# Patient Record
Sex: Female | Born: 1995 | Race: White | Hispanic: No | Marital: Married | Smoking: Never smoker
Health system: Southern US, Community
[De-identification: ages and names within clinical notes are randomized; demographics above are authoritative.]

## PROBLEM LIST (undated history)

## (undated) DIAGNOSIS — I519 Heart disease, unspecified: Secondary | ICD-10-CM

## (undated) DIAGNOSIS — I471 Supraventricular tachycardia, unspecified: Secondary | ICD-10-CM

## (undated) DIAGNOSIS — I456 Pre-excitation syndrome: Secondary | ICD-10-CM

## (undated) DIAGNOSIS — R011 Cardiac murmur, unspecified: Secondary | ICD-10-CM

## (undated) DIAGNOSIS — I499 Cardiac arrhythmia, unspecified: Secondary | ICD-10-CM

## (undated) DIAGNOSIS — N946 Dysmenorrhea, unspecified: Secondary | ICD-10-CM

## (undated) DIAGNOSIS — Z8619 Personal history of other infectious and parasitic diseases: Secondary | ICD-10-CM

## (undated) DIAGNOSIS — F419 Anxiety disorder, unspecified: Secondary | ICD-10-CM

## (undated) HISTORY — DX: Personal history of other infectious and parasitic diseases: Z86.19

## (undated) HISTORY — DX: Supraventricular tachycardia, unspecified: I47.10

## (undated) HISTORY — DX: Pre-excitation syndrome: I45.6

## (undated) HISTORY — DX: Anxiety disorder, unspecified: F41.9

## (undated) HISTORY — DX: Heart disease, unspecified: I51.9

## (undated) HISTORY — DX: Dysmenorrhea, unspecified: N94.6

## (undated) HISTORY — DX: Cardiac arrhythmia, unspecified: I49.9

## (undated) HISTORY — DX: Other specified conditions originating in the perinatal period: R01.1

## (undated) HISTORY — DX: Supraventricular tachycardia: I47.1

---
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
## 2014-03-15 ENCOUNTER — Other Ambulatory Visit: Payer: Self-pay | Admitting: Family Medicine

## 2014-06-01 ENCOUNTER — Ambulatory Visit (INDEPENDENT_AMBULATORY_CARE_PROVIDER_SITE_OTHER): Payer: Managed Care, Other (non HMO) | Admitting: Family Medicine

## 2014-06-01 ENCOUNTER — Encounter: Payer: Self-pay | Admitting: Family Medicine

## 2014-06-01 VITALS — BP 118/78 | HR 117 | Temp 99.0°F | Wt 203.4 lb

## 2014-06-01 DIAGNOSIS — J029 Acute pharyngitis, unspecified: Secondary | ICD-10-CM | POA: Insufficient documentation

## 2014-06-01 NOTE — Patient Instructions (Signed)
I think your sore throat is coming from a virus  Antibiotics do not help  Your strep test is negative Drink lots of fluids Gargle with salt water  Try chloraseptic throat spray  Tylenol or motrin/ advil may sore throat -take as directed (also will help fever)  Get lots of rest    Update if not starting to improve in a week or if worsening

## 2014-06-01 NOTE — Assessment & Plan Note (Signed)
Suspect viral  Neg RST Disc symptomatic care - see instructions on AVS   Update if not starting to improve in a week or if worsening

## 2014-06-01 NOTE — Progress Notes (Signed)
Pre visit review using our clinic review tool, if applicable. No additional management support is needed unless otherwise documented below in the visit note. 

## 2014-06-01 NOTE — Progress Notes (Signed)
Subjective:    Patient ID: Abigail Pham, female    DOB: 1995/04/09, 19 y.o.   MRN: [id number]  HPI Here with ST and feeling poorly   Started last night 10 pm  ST is fairly bad  No cough or runny nose   99 temp today  ? If fever at home  No chills  Feels hot  No body aches   Feels tired and weak  Does have a mild headache  No rash   Results for orders placed or performed in visit on 06/01/14  POCT rapid strep A  Result Value Ref Range   Rapid Strep A Screen Negative Negative     Patient Active Problem List   Diagnosis Date Noted  . Poor concentration 11/06/2013  . History of ADHD 11/06/2013  . Dysmenorrhea 10/20/2012  . Screen for STD (sexually transmitted disease) 10/20/2012  . WPW (Wolff-Parkinson-White syndrome) 11/09/2010   Past Medical History  Diagnosis Date  . WPW (Wolff-Parkinson-White syndrome)     Hosp at [facility] at one month of age for Tachyarrrythmia and pulmonary hemorrhage  . History of chicken pox     around age 89  . Heart murmur of newborn    No past surgical history on file. History  Substance Use Topics  . Smoking status: Passive Smoke Exposure - Never Smoker  . Smokeless tobacco: Not on file     Comment: pt smokes vapors occ  . Alcohol Use: No   Family History  Problem Relation Age of Onset  . Stroke Paternal Grandfather   . High blood pressure Father   . High blood pressure Paternal Grandfather    Allergies  Allergen Reactions  . Other Other (See Comments)    Antihistamines or other stimulants-heart races   Current Outpatient Prescriptions on File Prior to Visit  Medication Sig Dispense Refill  . norethindrone-ethinyl estradiol (JUNEL FE,GILDESS FE,LOESTRIN FE) 1-20 MG-MCG tablet TAKE 1 TABLET BY MOUTH DAILY. 84 tablet 1   No current facility-administered medications on file prior to visit.      Review of Systems Review of Systems  Constitutional: Negative for fever, appetite change, fatigue and unexpected weight change.    Eyes: Negative for pain and visual disturbance.  ENT neg for rhinorrhea or cong, pos for post drip and st Respiratory: Negative for cough and shortness of breath.  neg for wheeze  Cardiovascular: Negative for cp or palpitations    Gastrointestinal: Negative for nausea, diarrhea and constipation.  Genitourinary: Negative for urgency and frequency.  Skin: Negative for pallor or rash   Neurological: Negative for weakness, light-headedness, numbness and headaches.  Hematological: Negative for adenopathy. Does not bruise/bleed easily.  Psychiatric/Behavioral: Negative for dysphoric mood. The patient is not nervous/anxious.         Objective:   Physical Exam  Constitutional: She appears well-developed and well-nourished. No distress.  obese and well appearing   HENT:  Head: Normocephalic and atraumatic.  Right Ear: External ear normal.  Left Ear: External ear normal.  Mouth/Throat: Oropharynx is clear and moist. No oropharyngeal exudate.  Boggy nares  Marked clear post nasal mucous drainage No redness or swelling or throat  No throat lesions   Eyes: Conjunctivae and EOM are normal. Pupils are equal, round, and reactive to light. Right eye exhibits no discharge. Left eye exhibits no discharge.  Neck: Normal range of motion. Neck supple.  Cardiovascular: Regular rhythm and normal heart sounds.   Pulmonary/Chest: Effort normal and breath sounds normal. No respiratory distress. She has  no wheezes. She has no rales. She exhibits no tenderness.  Lymphadenopathy:    She has no cervical adenopathy.  Neurological: She is alert.  Skin: Skin is warm and dry. No rash noted. No erythema.  Psychiatric: She has a normal mood and affect.          Assessment & Plan:   Problem List Items Addressed This Visit      Other   Sore throat - Primary    Suspect viral  Neg RST Disc symptomatic care - see instructions on AVS   Update if not starting to improve in a week or if worsening         Relevant Orders   POCT rapid strep A

## 2014-06-03 ENCOUNTER — Telehealth: Payer: Self-pay | Admitting: Family Medicine

## 2014-06-03 LAB — POCT RAPID STREP A (OFFICE): RAPID STREP A SCREEN: NEGATIVE

## 2014-06-03 NOTE — Telephone Encounter (Signed)
I would agree with re eval if persistent sxs into next week vs urgent care over weekend.

## 2014-06-03 NOTE — Telephone Encounter (Signed)
Patient Name: Abigail SoxKALIE Hensch DOB: February 15, 1996 Initial Comment Caller states his daughter was seen on Tues. She had an irritated throat. There are white spots. The strep test came up negative. Nurse Assessment Nurse: Loletta Specterorbin, RN, Misty StanleyLisa Date/Time Lamount Cohen(Eastern Time): 06/03/2014 4:09:39 PM Confirm and document reason for call. If symptomatic, describe symptoms. ---Caller states his daughter has cough, congestion and runny nose since Tuesday. Sore throat since Tuesday. Low grade fever. Seen in office on Tuesday and rapid strep was negative, Has the patient traveled out of the country within the last 30 days? ---No Does the patient require triage? ---Yes Related visit to physician within the last 2 weeks? ---Yes Does the PT have any chronic conditions? (i.e. diabetes, asthma, etc.) ---No Did the patient indicate they were pregnant? ---No Guidelines Guideline Title Affirmed Question Affirmed Notes Sore Throat [1] Sore throat with cough/cold symptoms AND [2] present > 5 days Final Disposition User See PCP When Office is Open (within 3 days) Loletta Specterorbin, Charity fundraiserN, Misty StanleyLisa

## 2014-06-07 NOTE — Telephone Encounter (Signed)
Spoke with patient's grandmother who said that patient was feeling better and was at work. She said she will have patient return my call tomorrow.

## 2014-11-15 ENCOUNTER — Other Ambulatory Visit: Payer: Self-pay | Admitting: Family Medicine

## 2014-11-16 NOTE — Telephone Encounter (Signed)
Electronic refill request, no recent f/u or CPE and no future appt, please advise  

## 2014-11-16 NOTE — Telephone Encounter (Signed)
Please schedule PE with first gyn exam after 08/21/15 and refill until then Thanks

## 2014-11-17 NOTE — Telephone Encounter (Signed)
Pt will call back closer to that date to schedule CPE, med refilled

## 2015-05-01 ENCOUNTER — Other Ambulatory Visit: Payer: Self-pay | Admitting: Family Medicine

## 2015-05-02 NOTE — Telephone Encounter (Signed)
Electronic refill request, no recent/future appt., please advise  

## 2015-05-02 NOTE — Telephone Encounter (Signed)
Please schedule summer f/u or PE and refill until then thanks

## 2015-05-03 NOTE — Telephone Encounter (Signed)
Spoke to patient.  CPE scheduled for 09/16/15 at 1545.  Refill approved to last until that time.

## 2015-05-24 ENCOUNTER — Ambulatory Visit (INDEPENDENT_AMBULATORY_CARE_PROVIDER_SITE_OTHER): Payer: Managed Care, Other (non HMO) | Admitting: Family Medicine

## 2015-05-24 ENCOUNTER — Encounter: Payer: Self-pay | Admitting: Family Medicine

## 2015-05-24 VITALS — BP 128/68 | HR 100 | Temp 98.3°F | Ht 67.25 in | Wt 195.2 lb

## 2015-05-24 DIAGNOSIS — J0100 Acute maxillary sinusitis, unspecified: Secondary | ICD-10-CM | POA: Diagnosis not present

## 2015-05-24 DIAGNOSIS — H6502 Acute serous otitis media, left ear: Secondary | ICD-10-CM | POA: Diagnosis not present

## 2015-05-24 DIAGNOSIS — J019 Acute sinusitis, unspecified: Secondary | ICD-10-CM | POA: Insufficient documentation

## 2015-05-24 DIAGNOSIS — H6690 Otitis media, unspecified, unspecified ear: Secondary | ICD-10-CM | POA: Insufficient documentation

## 2015-05-24 MED ORDER — ALBUTEROL SULFATE HFA 108 (90 BASE) MCG/ACT IN AERS: 2.0000 | INHALATION_SPRAY | RESPIRATORY_TRACT | Status: DC | PRN

## 2015-05-24 MED ORDER — AMOXICILLIN-POT CLAVULANATE 875-125 MG PO TABS: 1.0000 | ORAL_TABLET | Freq: Two times a day (BID) | ORAL | Status: DC

## 2015-05-24 MED ORDER — GUAIFENESIN-CODEINE 100-10 MG/5ML PO SYRP: 5.0000 mL | ORAL_SOLUTION | Freq: Four times a day (QID) | ORAL | Status: DC | PRN

## 2015-05-24 NOTE — Patient Instructions (Addendum)
Don't go back to vaping nicotine  Drink lots of fluids and rest  Take augmentin for the ear infection and sinus infection  Take the robitussin with codeine for cough as needed when not working or driving (that will sedate)  Use the inhaler if wheezing as directed (this will raise heart rate briefly after use) -see the handout I gave you re: technique  Update if not starting to improve in a week or if worsening

## 2015-05-24 NOTE — Progress Notes (Signed)
Subjective:    Patient ID: Abigail Pham, female    DOB: 02/22/1996, 20 y.o.   MRN: [id number]  HPI  Here with uri symptoms for 6 days  (acutally longer and then got better and then worse) Hoarse voice   Not getting better  Coughing a lot - so hard she almost vomits, phlegm is green yellow  Lot of congestion - green yellow  Ears hurt - L is worse  Headache - in her face - cheeks and jaw Ribs are sore  Some wheeze on and off  pnd  L eye is red also itchy and draining   Had temp a few days ago 103 - came down with an "ice pack"  Taking mucinex and robitussin  No further fever No analgesics   Non smoker Does vap 3 mg nicotine   Patient Active Problem List   Diagnosis Date Noted  . Sore throat 06/01/2014  . Poor concentration 11/06/2013  . History of ADHD 11/06/2013  . Dysmenorrhea 10/20/2012  . Screen for STD (sexually transmitted disease) 10/20/2012  . WPW (Wolff-Parkinson-White syndrome) 11/09/2010   Past Medical History  Diagnosis Date  . WPW (Wolff-Parkinson-White syndrome)     Hosp at [facility] at one month of age for Tachyarrrythmia and pulmonary hemorrhage  . History of chicken pox     around age 17  . Heart murmur of newborn    No past surgical history on file. Social History  Substance Use Topics  . Smoking status: Passive Smoke Exposure - Never Smoker  . Smokeless tobacco: None     Comment: pt smokes vapors occ  . Alcohol Use: No   Family History  Problem Relation Age of Onset  . Stroke Paternal Grandfather   . High blood pressure Father   . High blood pressure Paternal Grandfather    Allergies  Allergen Reactions  . Other Other (See Comments)    Antihistamines or other stimulants-heart races   Current Outpatient Prescriptions on File Prior to Visit  Medication Sig Dispense Refill  . JUNEL FE 1/20 1-20 MG-MCG tablet TAKE 1 TABLET BY MOUTH EVERY DAY 84 tablet 0   No current facility-administered medications on file prior to visit.    Review of  Systems  Constitutional: Positive for appetite change and fatigue. Negative for fever.  HENT: Positive for congestion, ear pain, postnasal drip, rhinorrhea, sinus pressure, sneezing and sore throat. Negative for nosebleeds.   Eyes: Positive for discharge. Negative for pain, redness and itching.  Respiratory: Positive for cough. Negative for shortness of breath, wheezing and stridor.   Cardiovascular: Negative for chest pain.  Gastrointestinal: Negative for nausea, vomiting, abdominal pain and diarrhea.  Endocrine: Negative for polyuria.  Genitourinary: Negative for dysuria, urgency, frequency and hematuria.  Musculoskeletal: Negative for myalgias and arthralgias.  Skin: Negative for rash.  Allergic/Immunologic: Negative for immunocompromised state.  Neurological: Positive for headaches. Negative for dizziness, tremors, syncope, weakness, light-headedness and numbness.  Hematological: Negative for adenopathy. Does not bruise/bleed easily.  Psychiatric/Behavioral: Negative for confusion and dysphoric mood. The patient is not nervous/anxious.        Objective:   Physical Exam  Constitutional: She appears well-developed and well-nourished. No distress.  obese and well appearing   HENT:  Head: Normocephalic and atraumatic.  Right Ear: External ear normal.  Mouth/Throat: Oropharynx is clear and moist. No oropharyngeal exudate.  Nares are injected and congested  Bilateral maxillary sinus tenderness -mild  L TM is erythematous with effusion  Post nasal drip noted Throat is  clear   Hoarse voice   Eyes: Conjunctivae and EOM are normal. Pupils are equal, round, and reactive to light. Right eye exhibits no discharge. Left eye exhibits no discharge.  Neck: Normal range of motion. Neck supple.  Cardiovascular: Regular rhythm and normal heart sounds.   Pulmonary/Chest: Effort normal and breath sounds normal. No respiratory distress. She has no wheezes. She has no rales. She exhibits no  tenderness.  No wheezes heard today Harsh bs   Lymphadenopathy:    She has no cervical adenopathy.  Neurological: She is alert. No cranial nerve deficit.  Skin: Skin is warm and dry. No rash noted.  Psychiatric: She has a normal mood and affect.          Assessment & Plan:   Problem List Items Addressed This Visit      Respiratory   Acute sinusitis    With cough and L OM  Pt also c/o wheezing (none on exam today) Cover with augmentin (will use back up contraception) Robitussin AC for cough with caution of sedation  Albuterol mdi -only if needed/disc risk of inc HR /given handout on technique  Adv to quit vaping nicotine as well   Update if not starting to improve in a week or if worsening        Relevant Medications   amoxicillin-clavulanate (AUGMENTIN) 875-125 MG tablet   guaiFENesin-codeine (ROBITUSSIN AC) 100-10 MG/5ML syrup     Nervous and Auditory   Otitis media - Primary    L with effusion  With uri and sinus infection  Cover with augmentin  Disc symptomatic care - see instructions on AVS  Update if not starting to improve in a week or if worsening        Relevant Medications   amoxicillin-clavulanate (AUGMENTIN) 875-125 MG tablet

## 2015-05-24 NOTE — Assessment & Plan Note (Signed)
L with effusion  With uri and sinus infection  Cover with augmentin  Disc symptomatic care - see instructions on AVS  Update if not starting to improve in a week or if worsening

## 2015-05-24 NOTE — Progress Notes (Signed)
Pre visit review using our clinic review tool, if applicable. No additional management support is needed unless otherwise documented below in the visit note. 

## 2015-05-24 NOTE — Assessment & Plan Note (Signed)
With cough and L OM  Pt also c/o wheezing (none on exam today) Cover with augmentin (will use back up contraception) Robitussin AC for cough with caution of sedation  Albuterol mdi -only if needed/disc risk of inc HR /given handout on technique  Adv to quit vaping nicotine as well   Update if not starting to improve in a week or if worsening

## 2015-06-06 ENCOUNTER — Other Ambulatory Visit: Payer: Self-pay | Admitting: Family Medicine

## 2015-09-16 ENCOUNTER — Encounter: Payer: Self-pay | Admitting: Family Medicine

## 2015-09-16 DIAGNOSIS — Z0289 Encounter for other administrative examinations: Secondary | ICD-10-CM

## 2015-12-01 ENCOUNTER — Encounter: Payer: Self-pay | Admitting: Family Medicine

## 2015-12-01 ENCOUNTER — Ambulatory Visit: Payer: Self-pay | Admitting: Family Medicine

## 2015-12-01 ENCOUNTER — Ambulatory Visit (INDEPENDENT_AMBULATORY_CARE_PROVIDER_SITE_OTHER): Payer: Self-pay | Admitting: Family Medicine

## 2015-12-01 VITALS — BP 112/82 | HR 85 | Wt 194.0 lb

## 2015-12-01 DIAGNOSIS — R300 Dysuria: Secondary | ICD-10-CM

## 2015-12-01 DIAGNOSIS — N3090 Cystitis, unspecified without hematuria: Secondary | ICD-10-CM

## 2015-12-01 LAB — POC URINALSYSI DIPSTICK (AUTOMATED)
BILIRUBIN UA: NEGATIVE
GLUCOSE UA: NEGATIVE
Ketones, UA: NEGATIVE
Nitrite, UA: NEGATIVE
Protein, UA: NEGATIVE
RBC UA: NEGATIVE
SPEC GRAV UA: 1.025
UROBILINOGEN UA: NEGATIVE
pH, UA: 6

## 2015-12-01 MED ORDER — NITROFURANTOIN MONOHYD MACRO 100 MG PO CAPS: 100.0000 mg | ORAL_CAPSULE | Freq: Two times a day (BID) | ORAL | 0 refills | Status: DC

## 2015-12-01 NOTE — Progress Notes (Signed)
Subjective:    Patient ID: Abigail Pham, female    DOB: 04-18-1995, 20 y.o.   MRN: [id number]  HPI This a 20 yo female who presents today with intermittent urinary frequency for about 3 weeks. She has noticed intermittent burning. She felt bad yesterday with urinary frequency and dysuria. Nocturia 2-3x. She has noticed some light headedness about 2-3 times (last episode about 4 days ago). Some vaginal itching yesterday. No discharge. Not sexually active currently. Patient's last menstrual period was 11/20/2015.   Past Medical History:  Diagnosis Date  . Heart murmur of newborn   . History of chicken pox    around age 13  . WPW (Wolff-Parkinson-White syndrome)    Hosp at [facility] at one month of age for Tachyarrrythmia and pulmonary hemorrhage   No past surgical history on file. Family History  Problem Relation Age of Onset  . Stroke Paternal Grandfather   . High blood pressure Father   . High blood pressure Paternal Grandfather    Social History  Substance Use Topics  . Smoking status: Passive Smoke Exposure - Never Smoker  . Smokeless tobacco: Not on file     Comment: pt smokes vapors occ with nicotine 3 mg   . Alcohol use No     Review of Systems Per HPI    Objective:   Physical Exam  Constitutional: She is oriented to person, place, and time. She appears well-developed and well-nourished. No distress.  HENT:  Head: Normocephalic and atraumatic.  Eyes: Conjunctivae are normal.  Cardiovascular: Normal rate, regular rhythm and normal heart sounds.   Pulmonary/Chest: Effort normal and breath sounds normal.  Abdominal: Soft. Bowel sounds are normal. She exhibits no distension. There is no tenderness. There is no rebound, no guarding and no CVA tenderness.  Neurological: She is alert and oriented to person, place, and time.  Skin: Skin is warm and dry. She is not diaphoretic.  Psychiatric: She has a normal mood and affect. Her behavior is normal. Judgment and thought content  normal.  Vitals reviewed.     BP 112/82   Pulse 85   Wt 194 lb (88 kg)   LMP 11/20/2015   SpO2 98%   BMI 30.16 kg/m  Wt Readings from Last 3 Encounters:  12/01/15 194 lb (88 kg)  05/24/15 195 lb 4 oz (88.6 kg)  06/01/14 203 lb 6.4 oz (92.3 kg) (98 %, Z= 1.99)*   * Growth percentiles are based on CDC 2-20 Years data.   Results for orders placed or performed in visit on 12/01/15  POCT Urinalysis Dipstick (Automated)  Result Value Ref Range   Color, UA YELLOW    Clarity, UA CLOUDY    Glucose, UA NEGATIVE    Bilirubin, UA NEGATIVE    Ketones, UA NEGATIVE    Spec Grav, UA 1.025    Blood, UA NEG    pH, UA 6.0    Protein, UA NEG    Urobilinogen, UA negative    Nitrite, UA NEG    Leukocytes, UA small (1+) (A) Negative       Assessment & Plan:  1. Dysuria - POCT Urinalysis Dipstick (Automated)  2. Cystitis - Provided written and verbal information regarding diagnosis and treatment. - Urine culture - nitrofurantoin, macrocrystal-monohydrate, (MACROBID) 100 MG capsule; Take 1 capsule (100 mg total) by mouth 2 (two) times daily.  Dispense: 14 capsule; Refill: 0 - POCT Urinalysis Dipstick (Automated) - RTC precautions reviewed  Olean Ree, FNP-BC  [facility]  [place], [place]  12/01/2015 3:48 PM

## 2015-12-01 NOTE — Patient Instructions (Signed)

## 2015-12-04 LAB — URINE CULTURE

## 2015-12-17 ENCOUNTER — Telehealth: Payer: Self-pay | Admitting: Family Medicine

## 2015-12-19 NOTE — Telephone Encounter (Signed)
Pt no-showed her CPE in July and has only been seen for recent acute appts

## 2015-12-19 NOTE — Telephone Encounter (Signed)
Please re schedule her PE and refill until then 

## 2015-12-20 NOTE — Telephone Encounter (Signed)
Patient advised and says she will call in to schedule CPE when she can look at her calendar.

## 2016-01-05 MED ORDER — NORETHIN ACE-ETH ESTRAD-FE 1-20 MG-MCG PO TABS: 1.0000 | ORAL_TABLET | Freq: Every day | ORAL | 0 refills | Status: DC

## 2016-01-05 NOTE — Telephone Encounter (Signed)
Patient called and scheduled physical appointment on 02/10/16.  Patient said her bcp will run out this Sunday.  Please call in rx for bcp.

## 2016-01-05 NOTE — Telephone Encounter (Signed)
done

## 2016-01-05 NOTE — Addendum Note (Signed)
Addended by: Shon MilletWATLINGTON, Justis Dupas M on: 01/05/2016 05:03 PM   Modules accepted: Orders

## 2016-01-20 ENCOUNTER — Other Ambulatory Visit: Payer: Self-pay | Admitting: Family Medicine

## 2016-01-28 ENCOUNTER — Encounter: Payer: Self-pay | Admitting: *Deleted

## 2016-01-28 ENCOUNTER — Emergency Department
Admission: EM | Admit: 2016-01-28 | Discharge: 2016-01-28 | Disposition: A | Discharge status: Home / Self Care | Payer: Self-pay | Attending: Emergency Medicine | Admitting: Emergency Medicine

## 2016-01-28 DIAGNOSIS — Z79899 Other long term (current) drug therapy: Secondary | ICD-10-CM | POA: Insufficient documentation

## 2016-01-28 DIAGNOSIS — T2611XA Burn of cornea and conjunctival sac, right eye, initial encounter: Secondary | ICD-10-CM | POA: Insufficient documentation

## 2016-01-28 DIAGNOSIS — Y929 Unspecified place or not applicable: Secondary | ICD-10-CM | POA: Insufficient documentation

## 2016-01-28 DIAGNOSIS — Y999 Unspecified external cause status: Secondary | ICD-10-CM | POA: Insufficient documentation

## 2016-01-28 DIAGNOSIS — X12XXXA Contact with other hot fluids, initial encounter: Secondary | ICD-10-CM | POA: Insufficient documentation

## 2016-01-28 DIAGNOSIS — Z7722 Contact with and (suspected) exposure to environmental tobacco smoke (acute) (chronic): Secondary | ICD-10-CM | POA: Insufficient documentation

## 2016-01-28 DIAGNOSIS — T2641XA Burn of right eye and adnexa, part unspecified, initial encounter: Secondary | ICD-10-CM

## 2016-01-28 DIAGNOSIS — Y9389 Activity, other specified: Secondary | ICD-10-CM | POA: Insufficient documentation

## 2016-01-28 MED ORDER — TETRACAINE HCL 0.5 % OP SOLN
2.0000 [drp] | Freq: Once | OPHTHALMIC | Status: AC
  Administered 2016-01-28: 2 [drp] via OPHTHALMIC

## 2016-01-28 MED ORDER — FLUORESCEIN SODIUM 1 MG OP STRP: 1.0000 | ORAL_STRIP | Freq: Once | OPHTHALMIC | Status: DC

## 2016-01-28 MED ORDER — ERYTHROMYCIN 5 MG/GM OP OINT
TOPICAL_OINTMENT | Freq: Once | OPHTHALMIC | Status: AC
  Administered 2016-01-28: 1 via OPHTHALMIC
  Filled 2016-01-28: qty 1

## 2016-01-28 MED ORDER — ERYTHROMYCIN 5 MG/GM OP OINT: TOPICAL_OINTMENT | Freq: Three times a day (TID) | OPHTHALMIC | 0 refills | Status: AC

## 2016-01-28 NOTE — ED Triage Notes (Signed)
Pt states she was vaping and the liquid became hot enough that it popped, striking R eye. Pt c/o eye pain x 1 hr, blurred and double vision. Pt states there is a gray spot on her R eye where the liquid from the vaping machine splattered in her eye. Pt's R eyelid is red and swollen.

## 2016-01-28 NOTE — ED Notes (Addendum)
Md at bedside. Right eye has grey dot at center. Pt reports blurred vision.

## 2016-01-28 NOTE — ED Notes (Addendum)
Poison control contacted and gave directions at followed; Irrigate eye with copious amount of water/NS for 15 mins followed by full eye exam. Lequita HaltMorgan lenses suggested. Assess acuity issues. Grey dot expects and follow up with opthalmologic.  MD made aware.

## 2016-01-28 NOTE — ED Notes (Signed)
Pt at eye wash station

## 2016-01-28 NOTE — ED Notes (Signed)
Pt reports burning in right eye. MD made aware and gave verbal order for 2 more drops of tetracaine in the right eye

## 2016-01-28 NOTE — ED Notes (Signed)
Pt reports her eye "feels better."

## 2016-01-28 NOTE — ED Notes (Signed)
MD at bedside. Dr. Manson PasseyBrown applied 2 drops of Alcon in right eye. Pt describes relief of eye pain.

## 2016-01-28 NOTE — ED Provider Notes (Signed)
[facility] Emergency Department Provider Note  ____________________________________________  Time seen: 3:45 AM  I have reviewed the triage vital signs and the nursing notes.   HISTORY  Chief Complaint Eye Injury     HPI Abigail Pham is a 20 y.o. female presents with history of accidentally getting vapor fluid liquid into her right eye approximately one hour before presentation. Patient admits to right eye pain as well as blurred vision     Past Medical History:  Diagnosis Date  . Heart murmur of newborn   . History of chicken pox    around age 89  . WPW (Wolff-Parkinson-White syndrome)    [facility] one [facility] of [facility] for Tachyarrrythmia and pulmonary hemorrhage    Patient Active Problem List   Diagnosis Date Noted  . Otitis media 05/24/2015  . Acute sinusitis 05/24/2015  . Sore throat 06/01/2014  . Poor concentration 11/06/2013  . History of ADHD 11/06/2013  . Dysmenorrhea 10/20/2012  . Screen for STD (sexually transmitted disease) 10/20/2012  . WPW (Wolff-Parkinson-White syndrome) 11/09/2010    History reviewed. No pertinent surgical history.  Current Outpatient Rx  . Order #: [id number] Class: Normal  . Order #: [id number] Class: Normal  . Order #: [id number] Class: Normal    Allergies Other  Family History  Problem Relation Age of Onset  . Stroke Paternal Grandfather   . High blood pressure Paternal Grandfather   . High blood pressure Father     Social History Social History  Substance Use Topics  . Smoking status: Passive Smoke Exposure - Never Smoker  . Smokeless tobacco: Never Used     Comment: pt smokes vapors occ with nicotine 3 mg   . Alcohol use 0.0 oz/week    Review of Systems  Constitutional: Negative for fever. Eyes: Negative for visual changes. ENT: Negative for sore throat. Cardiovascular: Negative for chest pain. Respiratory: Negative for shortness of breath. Gastrointestinal: Negative for abdominal pain,  vomiting and diarrhea. Genitourinary: Negative for dysuria. Musculoskeletal: Negative for back pain. Skin: Negative for rash. Neurological: Negative for headaches, focal weakness or numbness.   10-point ROS otherwise negative.  ____________________________________________   PHYSICAL EXAM:  VITAL SIGNS: ED Triage Vitals  Enc Vitals Group     BP 01/28/16 0334 135/87     Pulse Rate 01/28/16 0334 92     Resp 01/28/16 0334 20     Temp 01/28/16 0334 98.1 F (36.7 C)     Temp Source 01/28/16 0334 Oral     SpO2 01/28/16 0334 98 %     Weight 01/28/16 0335 192 lb (87.1 kg)     Height 01/28/16 0335 5\' 8"  (1.727 m)     Head Circumference --      Peak Flow --      Pain Score 01/28/16 0335 8     Pain Loc --      Pain Edu? --      Excl. in GC? --     Constitutional: Alert and oriented. Well appearing and in no distress. Eyes: Conjunctivae And scleral erythema. PERRL. Normal extraocular movements. ENT   Head: Normocephalic and atraumatic.   Nose: No congestion/rhinnorhea.   Mouth/Throat: Mucous membranes are moist.   Neck: No stridor. Hematological/Lymphatic/Immunilogical: No cervical lymphadenopathy. Cardiovascular: Normal rate, regular rhythm. Normal and symmetric distal pulses are present in all extremities. No murmurs, rubs, or gallops. Respiratory: Normal respiratory effort without tachypnea nor retractions. Breath sounds are clear and equal bilaterally. No wheezes/rales/rhonchi. Gastrointestinal: Soft and nontender. No distention.  There is no CVA tenderness. Genitourinary: deferred Musculoskeletal: Nontender with normal range of motion in all extremities. No joint effusions.  No lower extremity tenderness nor edema. Neurologic:  Normal speech and language. No gross focal neurologic deficits are appreciated. Speech is normal.  Skin:  Skin is warm, dry and intact. No rash noted. Psychiatric: Mood and affect are normal. Speech and behavior are normal. Patient exhibits  appropriate insight and judgment.   Procedures     INITIAL IMPRESSION / ASSESSMENT AND PLAN / ED COURSE  Pertinent labs & imaging results that were available during my care of the patient were reviewed by me and considered in my medical decision making (see chart for details).  Patient was taken immediately to the eye wash after my evaluation which eyes were irrigated for 15 minutes as per poison control recommendation. Tetracaine was applied to the right eye which gave the patient pain relief. Erythromycin ointment applied. Patient will be referred to Dr. Inez PilgrimBrasington ophthalmologist for further outpatient evaluation ____________________________________________   FINAL CLINICAL IMPRESSION(S) / ED DIAGNOSES  Final diagnoses:  Burn of right eye region, initial encounter      Darci Currentandolph N Garyn Arlotta, MD 01/28/16 [phone]

## 2016-01-28 NOTE — ED Notes (Signed)
Judie GrieveBryan, RN at bedside testing pH of right eye. Reading of 6.5. Reading verified with MD. Eye wash used to irrigate right eye.

## 2016-01-28 NOTE — ED Notes (Signed)
Pt's eyes washed via eye wash in Cpod.

## 2016-02-10 ENCOUNTER — Ambulatory Visit (INDEPENDENT_AMBULATORY_CARE_PROVIDER_SITE_OTHER): Payer: Self-pay | Admitting: Family Medicine

## 2016-02-10 ENCOUNTER — Encounter: Payer: Self-pay | Admitting: Family Medicine

## 2016-02-10 VITALS — BP 104/66 | HR 83 | Temp 97.5°F | Ht 67.5 in | Wt 189.0 lb

## 2016-02-10 DIAGNOSIS — N946 Dysmenorrhea, unspecified: Secondary | ICD-10-CM

## 2016-02-10 DIAGNOSIS — Z0000 Encounter for general adult medical examination without abnormal findings: Secondary | ICD-10-CM

## 2016-02-10 MED ORDER — NORETHIN ACE-ETH ESTRAD-FE 1-20 MG-MCG PO TABS: 1.0000 | ORAL_TABLET | Freq: Every day | ORAL | 3 refills | Status: DC

## 2016-02-10 NOTE — Assessment & Plan Note (Signed)
Reviewed health habits including diet and exercise and skin cancer prevention Reviewed appropriate screening tests for age  Also reviewed health mt list, fam hx and immunization status , as well as social and family history    Cannot afford pap- did breast exam today  She will check into getting it at the health dept or return after she gets ins  Also plans on flu shot and HPV vaccine at the health dept

## 2016-02-10 NOTE — Progress Notes (Signed)
Subjective:    Patient ID: Abigail Pham, female    DOB: [birth year], 20 y.o.   MRN: [id number]  HPI Here for annual exam   Works at [employer]  A good place to work  Taking care of herself  Also in school for vet tech    Exercise - counts her steps with an app - ranges 10,000 or more usually   Has a new dog- training and will walking a lot   Declines flu shot since no insurance  Waiting for her father's health insurance to come back through    [employer] Readings from Last 3 Encounters:  02/10/16 189 lb (85.7 kg)  01/28/16 192 lb (87.1 kg)  12/01/15 194 lb (88 kg)  down 5 lb since sept  Drinking a lot of water and more exercise (did an exercise challenge for a while -enjoyed  It)  Eating fairly healthy (does not eat food at work)  bmi is 29.1  HIV screening   Tetanus shot 2011  HPV vaccine   Pap - pt states she cannot afford   Menses- regular  Only cramping for one day and not as severe / heavy the first 2 days  Does not want to change the brand  On  junel FE 1/20  Is currently sexually active  Does not desire std testing (normal in 2014)-no new partners/monogamous   Patient Active Problem List   Diagnosis Date Noted  . Routine general medical examination at a health care facility 02/10/2016  . Poor concentration 11/06/2013  . History of ADHD 11/06/2013  . Dysmenorrhea 10/20/2012  . Screen for STD (sexually transmitted disease) 10/20/2012  . WPW (Wolff-Parkinson-White syndrome) 11/09/2010   Past Medical History:  Diagnosis Date  . Heart murmur of newborn   . History of chicken pox    around age 89  . WPW (Wolff-Parkinson-White syndrome)    [facility] of [facility] for Tachyarrrythmia and pulmonary hemorrhage   No past surgical history on file. Social History  Substance Use Topics  . Smoking status: Passive Smoke Exposure - Never Smoker  . Smokeless tobacco: Never Used     Comment: pt smokes vapors occ with nicotine 3 mg   . Alcohol use 0.0 oz/week    Family History  Problem Relation Age of Onset  . Stroke Paternal Grandfather   . High blood pressure Paternal Grandfather   . High blood pressure Father    Allergies  Allergen Reactions  . Other Other (See Comments)    Antihistamines or other stimulants-heart races   No current outpatient prescriptions on file prior to visit.   No current facility-administered medications on file prior to visit.     Review of Systems Review of Systems  Constitutional: Negative for fever, appetite change,  and unexpected weight change. pos for fatigue from schedule  Eyes: Negative for pain and visual disturbance.  Respiratory: Negative for cough and shortness of breath.   Cardiovascular: Negative for cp or palpitations    Gastrointestinal: Negative for nausea, diarrhea and constipation.  Genitourinary: Negative for urgency and frequency. pos for menstrual cramps that are improved with OC  Skin: Negative for pallor or rash   Neurological: Negative for weakness, light-headedness, numbness and headaches.  Hematological: Negative for adenopathy. Does not bruise/bleed easily.  Psychiatric/Behavioral: Negative for dysphoric mood. The patient is not nervous/anxious.         Objective:   Physical Exam  Constitutional: She appears well-developed and well-nourished. No distress.  obese and  well appearing   HENT:  Head: Normocephalic and atraumatic.  Right Ear: External ear normal.  Left Ear: External ear normal.  Nose: Nose normal.  Mouth/Throat: Oropharynx is clear and moist.  Eyes: Conjunctivae and EOM are normal. Pupils are equal, round, and reactive to light. Right eye exhibits no discharge. Left eye exhibits no discharge. No scleral icterus.  Neck: Normal range of motion. Neck supple. No JVD present. Carotid bruit is not present. No thyromegaly present.  Cardiovascular: Normal rate, regular rhythm, normal heart sounds and intact distal pulses.  Exam reveals no gallop.   Pulmonary/Chest:  Effort normal and breath sounds normal. No respiratory distress. She has no wheezes. She has no rales.  Abdominal: Soft. Bowel sounds are normal. She exhibits no distension and no mass. There is no tenderness.  Genitourinary:  Genitourinary Comments: Breast exam: No mass, nodules, thickening, tenderness, bulging, retraction, inflamation, nipple discharge or skin changes noted.  No axillary or clavicular LA.    Breast tissue is quite dense  Musculoskeletal: She exhibits no edema or tenderness.  Lymphadenopathy:    She has no cervical adenopathy.  Neurological: She is alert. She has normal reflexes. No cranial nerve deficit. She exhibits normal muscle tone. Coordination normal.  Skin: Skin is warm and dry. No rash noted. No erythema. No pallor.  Lentigines diffusley  Psychiatric: She has a normal mood and affect.          Assessment & Plan:   Problem List Items Addressed This Visit      Genitourinary   Dysmenorrhea - Primary    Refilled oral contraceptive Urged her to stop vaping nicotine products due to blood clot risk She agreed  Declined std testing-monogamous for years        Other   Routine general medical examination at a health care facility    Reviewed health habits including diet and exercise and skin cancer prevention Reviewed appropriate screening tests for age  Also reviewed health mt list, fam hx and immunization status , as well as social and family history    Cannot afford pap- did breast exam today  She will check into getting it at the health dept or return after she gets ins  Also plans on flu shot and HPV vaccine at the health dept

## 2016-02-10 NOTE — Patient Instructions (Addendum)
Think about getting a flu shot and starting the HPV vaccine at the health department  For the flu shot - also price compare at [employer]/ [facility] or [facility]  Also - you could consider getting pap and pap or pelvic exam at the health dept or planned parent hood  Or -if you get insurance you can make an appointment with me for your pap (when you are not having your period)   The days that you do not make your step quota - go for walks   We refilled your oral contraceptive Please work on stopping nicotine use /vaping

## 2016-02-10 NOTE — Assessment & Plan Note (Signed)
Refilled oral contraceptive Urged her to stop vaping nicotine products due to blood clot risk She agreed  Declined std testing-monogamous for years

## 2016-02-10 NOTE — Progress Notes (Signed)
Pre visit review using our clinic review tool, if applicable. No additional management support is needed unless otherwise documented below in the visit note. 

## 2016-08-16 ENCOUNTER — Telehealth: Payer: Self-pay | Admitting: Family Medicine

## 2016-08-16 ENCOUNTER — Ambulatory Visit: Payer: Self-pay | Admitting: Family Medicine

## 2016-08-16 DIAGNOSIS — Z0289 Encounter for other administrative examinations: Secondary | ICD-10-CM

## 2016-08-16 NOTE — Telephone Encounter (Signed)
Pt did not show up for their acute visit.  Do you want to charge No Show Fee?  Pt called at 1235 to cancel her appointment

## 2016-12-03 DIAGNOSIS — R51 Headache: Secondary | ICD-10-CM

## 2016-12-03 NOTE — ED Triage Notes (Signed)
Upper and lower jaw pain right side x 2 weeks.

## 2016-12-03 NOTE — ED Provider Notes (Signed)
[facility] Care  Emergency Department Treatment Report    Patient: Casey Schwartz Age: 21 y.o. Sex: female    Date of Birth: 11/26/1995 Admit Date: 12/03/2016 PCP: None   MRN: [id number]  CSN: [id number]  Attending: Wynelle Bourgeois, MD   Room: 112/EO12 Time Dictated: 10:42 PM APP: Ahmed Prima, PA-C     Chief Complaint   Chief Complaint   Patient presents with   ??? Dental Pain       History of Present Illness   21 y.o. female complains of constant severe 10/10 pain to right lower and upper teeth x 2 weeks. Pain radiates throughout right sides of face and causes a headache. Taking 400 mg Ibuprofen with no relief. Took last dose around 11 AM. She doesn't have a dentist.     Review of Systems   Constitutional: No fever, chills  ENT: See above.   RESP: No difficulty breathing  GI: No nausea, vomiting  SKIN: See above. No facial swelling.   NEURO: See above.     Past Medical/Surgical History   No medical issues.     Social History     Social History     Social History   ??? Marital status: SINGLE     Spouse name: N/A   ??? Number of children: N/A   ??? Years of education: N/A     Social History Main Topics   ??? Smoking status: Never Smoker   ??? Smokeless tobacco: None   ??? Alcohol use No   ??? Drug use: No   ??? Sexual activity: Not Asked     Other Topics Concern   ??? None     Social History Narrative   ??? None     Patient denies pregnancy.     Family History   History reviewed. No pertinent family history.    Current Medications     None     Allergies   No Known Allergies  Physical Exam   ED Triage Vitals   ED Encounter Vitals Group      BP 12/03/16 2155 154/107      Pulse (Heart Rate) 12/03/16 2155 84      Resp Rate 12/03/16 2155 16      Temp 12/03/16 2155 99.1 ??F (37.3 ??C)      Temp src --       O2 Sat (%) 12/03/16 2155 97 %      Weight 12/03/16 2146 250 lb      Height 12/03/16 2146 5\' 3"      Constitutional: Well developed and well nourished. Seated comfortably on bed.    EYES: Conjuctivae clear, lids normal.  Pupils equal, symmetrical, and normally reactive.  ENT: EARS: Canals and TM's are clear bilaterally.   MOUTH/THROAT: No facial swelling. Oral mucosa is pink, moist and without lesions.  No trismus or sublingual elevation.  Floor of mouth soft. No drooling.  Phonating without issue. Uvula midline. Posterior pharynx unremarkable. Airway patent, no stridor.   TEETH: Right lower first molar is a small nub.  She states that the tooth has been like this since she was a teenager.  Tooth and surrounding gingiva tender to palpation.  There is a small fracture to the right upper second premolar, tooth and surrounding gingiva are tender. No gingival swelling, erythema, or fluctuance.  No abscesses.  NECK: Soft, supple, nontender. No submandibular masses.   RESP: Lungs clear bilaterally.   CV: Regular rate and rhythm. No murmurs.   MSK: Moves all  4 extremities spontaneously.   NEURO: Alert, oriented.    Impression and Management Plan   Patient complaining of right upper and lower dental pain that radiates throughout her face and causes headaches.  No signs of abscess.  No suspicion for deep neck space infection.  Will cover prophylactically for infection starting patient on oral antibiotics.    Diagnostic Studies   None.     ED Course/Medical Decision Making     Medications   ibuprofen (MOTRIN) tablet 600 mg (600 mg Oral Given 12/03/16 2309)   penicillin v potassium (VEETID) tablet 500 mg (500 mg Oral Given 12/03/16 2309)     She received oral Motrin and oral penicillin.  Given ibuprofen and penicillin prescriptions.  Instructed to follow-up with dentist within 1 week for further evaluation.  Dental referral along with multiple local dental resources provided.  Advised to return to ED immediately for any worsening symptoms.    Additionally her BP was elevated during her ED stay.  Patient reports that she had high blood pressure after giving birth >1 year ago but never  required medication and then never followed up.  She has insurance but no PCP.  PCP referral provided and instructed to follow-up within 1 week to have blood pressure rechecked.    Final Diagnosis       ICD-10-CM ICD-9-CM   1. Dentalgia K08.89 525.9   2. Right facial pain R51 784.0   3. Elevated blood pressure reading R03.0 796.2       Disposition   Home in stable condition.     Ahmed Prima, PA-C  December 03, 2016    The patient was fully discussed with attending Wynelle Bourgeois, MD who agrees with the above assessment and plan.    Nursing notes have been reviewed by the physician/ advanced practice Clinician.    Dragon medical dictation was used for portions of this report. Unintended grammatical errors may occur.    My signature above authenticates this document and my orders, the final diagnosis (es), discharge prescription (s), and instructions in the Epic record. If you have any questions please contact [phone].  ??

## 2016-12-03 NOTE — ED Triage Notes (Signed)
Upper and lower right side of mouth dental pain. Poor dentition.

## 2016-12-04 ENCOUNTER — Inpatient Hospital Stay
Admit: 2016-12-04 | Discharge: 2016-12-04 | Disposition: A | Discharge status: Home / Self Care | Payer: PRIVATE HEALTH INSURANCE | Attending: Emergency Medicine

## 2016-12-04 MED ORDER — IBUPROFEN 800 MG TAB
800 mg | ORAL_TABLET | Freq: Three times a day (TID) | ORAL | 0 refills | Status: AC | PRN
Start: 2016-12-04 — End: ?

## 2016-12-04 MED ORDER — PENICILLIN V-K 500 MG TAB
500 mg | ORAL | Status: AC
Start: 2016-12-04 — End: 2016-12-03
  Administered 2016-12-04: 03:00:00 via ORAL

## 2016-12-04 MED ORDER — IBUPROFEN 200 MG TAB
200 mg | ORAL | Status: AC
Start: 2016-12-04 — End: 2016-12-03
  Administered 2016-12-04: 03:00:00 via ORAL

## 2016-12-04 MED ORDER — PENICILLIN V-K 500 MG TAB
500 mg | ORAL_TABLET | Freq: Four times a day (QID) | ORAL | 0 refills | Status: AC
Start: 2016-12-04 — End: 2016-12-10

## 2016-12-04 MED FILL — PENICILLIN V-K 500 MG TAB: 500 mg | ORAL | Qty: 1

## 2016-12-04 MED FILL — IBUPROFEN 200 MG TAB: 200 mg | ORAL | Qty: 1

## 2016-12-04 MED FILL — IBUPROFEN 400 MG TAB: 400 mg | ORAL | Qty: 1

## 2016-12-16 ENCOUNTER — Other Ambulatory Visit: Payer: Self-pay | Admitting: Family Medicine

## 2017-02-11 ENCOUNTER — Other Ambulatory Visit: Payer: Self-pay

## 2017-02-11 DIAGNOSIS — R531 Weakness: Secondary | ICD-10-CM | POA: Insufficient documentation

## 2017-02-11 DIAGNOSIS — Z79899 Other long term (current) drug therapy: Secondary | ICD-10-CM | POA: Insufficient documentation

## 2017-02-11 DIAGNOSIS — R0602 Shortness of breath: Secondary | ICD-10-CM | POA: Diagnosis present

## 2017-02-11 DIAGNOSIS — Z7722 Contact with and (suspected) exposure to environmental tobacco smoke (acute) (chronic): Secondary | ICD-10-CM | POA: Diagnosis not present

## 2017-02-11 LAB — BASIC METABOLIC PANEL
ANION GAP: 12 (ref 5–15)
BUN: 13 mg/dL (ref 6–20)
CALCIUM: 9.3 mg/dL (ref 8.9–10.3)
CO2: 22 mmol/L (ref 22–32)
CREATININE: 0.8 mg/dL (ref 0.44–1.00)
Chloride: 105 mmol/L (ref 101–111)
Glucose, Bld: 105 mg/dL — ABNORMAL HIGH (ref 65–99)
Potassium: 3.6 mmol/L (ref 3.5–5.1)
SODIUM: 139 mmol/L (ref 135–145)

## 2017-02-11 LAB — CBC
HCT: 43.9 % (ref 35.0–47.0)
HEMOGLOBIN: 15 g/dL (ref 12.0–16.0)
MCH: 29.5 pg (ref 26.0–34.0)
MCHC: 34.2 g/dL (ref 32.0–36.0)
MCV: 86.1 fL (ref 80.0–100.0)
PLATELETS: 249 10*3/uL (ref 150–440)
RBC: 5.09 MIL/uL (ref 3.80–5.20)
RDW: 12.6 % (ref 11.5–14.5)
WBC: 9.1 10*3/uL (ref 3.6–11.0)

## 2017-02-11 LAB — URINALYSIS, COMPLETE (UACMP) WITH MICROSCOPIC
Bacteria, UA: NONE SEEN
Bilirubin Urine: NEGATIVE
GLUCOSE, UA: NEGATIVE mg/dL
HGB URINE DIPSTICK: NEGATIVE
Ketones, ur: NEGATIVE mg/dL
Leukocytes, UA: NEGATIVE
NITRITE: NEGATIVE
PH: 7 (ref 5.0–8.0)
Protein, ur: 30 mg/dL — AB
Specific Gravity, Urine: 1.026 (ref 1.005–1.030)

## 2017-02-11 LAB — POCT PREGNANCY, URINE: Preg Test, Ur: NEGATIVE

## 2017-02-11 LAB — TROPONIN I: Troponin I: <0.03 ng/mL (ref <0.03)

## 2017-02-11 NOTE — ED Triage Notes (Signed)
Pt presents to ED via POV from home with c/o weakness and SHOB x1 hr. Pt denies any c/o CP or abdominal pain, no N/V/D. Pt reports history of WPW. Pt is A&O, in NAD; RR even, regular, and unlabored.

## 2017-02-11 NOTE — ED Notes (Signed)
EKG performed by Misty StanleyLisa, EDT.

## 2017-02-12 ENCOUNTER — Emergency Department: Payer: 59

## 2017-02-12 ENCOUNTER — Emergency Department
Admission: EM | Admit: 2017-02-12 | Discharge: 2017-02-12 | Disposition: A | Discharge status: Home / Self Care | Payer: 59 | Attending: Emergency Medicine | Admitting: Emergency Medicine

## 2017-02-12 DIAGNOSIS — R531 Weakness: Secondary | ICD-10-CM

## 2017-02-12 DIAGNOSIS — R0602 Shortness of breath: Secondary | ICD-10-CM

## 2017-02-12 MED ORDER — SODIUM CHLORIDE 0.9 % IV BOLUS (SEPSIS)
1000.0000 mL | Freq: Once | INTRAVENOUS | Status: AC
  Administered 2017-02-12: 1000 mL via INTRAVENOUS

## 2017-02-12 MED ORDER — LORAZEPAM 1 MG PO TABS
1.0000 mg | ORAL_TABLET | Freq: Once | ORAL | Status: AC
  Administered 2017-02-12: 1 mg via ORAL
  Filled 2017-02-12: qty 1

## 2017-02-12 NOTE — Discharge Instructions (Signed)
Please follow up with your primary care physician.

## 2017-02-12 NOTE — ED Provider Notes (Signed)
[facility] Emergency Department Provider Note   ____________________________________________   First MD Initiated Contact with Patient 02/12/17 0304     (approximate)  I have reviewed the triage vital signs and the nursing notes.   HISTORY  Chief Complaint Weakness and Shortness of Breath    HPI Abigail Pham is a 21 y.o. female who comes into the hospital today with some generalized weakness, taking deep breaths and feeling dizzy.  She reports that she is using all of her energy.  The patient states that this started around 1030.  She was taking her friend home when this all started.  She was weak all over.  She denies any chest pain but again feels like she has to take a deep breath just to breathe comfortably.  She feels like she is not getting enough oxygen.  The patient denies any abdominal pain, nausea, vomiting.  She reports that this does occur often but not to this extreme.  She states that she is never been evaluated.  She decided to come into the hospital today for further evaluation of her symptoms.   Past Medical History:  Diagnosis Date  . Heart murmur of newborn   . History of chicken pox    around age 89  . WPW (Wolff-Parkinson-White syndrome)    Hosp at [facility] at [facility] month of age for Tachyarrrythmia and pulmonary hemorrhage    Patient Active Problem List   Diagnosis Date Noted  . Routine general medical examination at a health care facility 02/10/2016  . Poor concentration 11/06/2013  . History of ADHD 11/06/2013  . Dysmenorrhea 10/20/2012  . Screen for STD (sexually transmitted disease) 10/20/2012  . WPW (Wolff-Parkinson-White syndrome) 11/09/2010    History reviewed. No pertinent surgical history.  Prior to Admission medications   Medication Sig Start Date End Date Taking? Authorizing Provider  JUNEL FE 1/20 1-20 MG-MCG tablet TAKE 1 TABLET BY MOUTH DAILY. 12/18/16  Yes Tower, Audrie GallusMarne A, MD    Allergies Other  Family History    Problem Relation Age of Onset  . Stroke Paternal Grandfather   . High blood pressure Paternal Grandfather   . High blood pressure Father     Social History Social History   Tobacco Use  . Smoking status: Passive Smoke Exposure - Never Smoker  . Smokeless tobacco: Never Used  . Tobacco comment: pt smokes vapors occ with nicotine 3 mg   Substance Use Topics  . Alcohol use: Yes    Alcohol/week: 0.0 oz  . Drug use: No    Review of Systems  Constitutional: No fever/chills Eyes: No visual changes. ENT: No sore throat. Cardiovascular: Denies chest pain. Respiratory:  shortness of breath. Gastrointestinal: No abdominal pain.  No nausea, no vomiting.  No diarrhea.  No constipation. Genitourinary: Negative for dysuria. Musculoskeletal: Negative for back pain. Skin: Negative for rash. Neurological: dizzy, generalized weakness   ____________________________________________   PHYSICAL EXAM:  VITAL SIGNS: ED Triage Vitals [02/11/17 2244]  Enc Vitals Group     BP 130/81     Pulse Rate (!) 107     Resp 18     Temp 98 F (36.7 C)     Temp Source Oral     SpO2 98 %     Weight 198 lb (89.8 kg)     Height 5\' 8"  (1.727 m)     Head Circumference      Peak Flow      Pain Score 0  Pain Loc      Pain Edu?      Excl. in GC?     Constitutional: Alert and oriented. Well appearing and in mild distress. Eyes: Conjunctivae are normal. PERRL. EOMI. Head: Atraumatic. Nose: No congestion/rhinnorhea. Mouth/Throat: Mucous membranes are moist.  Oropharynx non-erythematous. Cardiovascular: Normal rate, regular rhythm. Grossly normal heart sounds.  Good peripheral circulation. Respiratory: Normal respiratory effort.  No retractions. Lungs CTAB. Gastrointestinal: Soft and nontender. No distention. Positive bowel sounds Musculoskeletal: No lower extremity tenderness nor edema.   Neurologic:  Normal speech and language.  Cranial nerves II through XII are grossly intact, poor effort  group but grip is intact as well as flexion and extension equal bilaterally.  No sensory deficits. Skin:  Skin is warm, dry and intact.  Psychiatric: Mood and affect are normal.   ____________________________________________   LABS (all labs ordered are listed, but only abnormal results are displayed)  Labs Reviewed  BASIC METABOLIC PANEL - Abnormal; Notable for the following components:      Result Value   Glucose, Bld 105 (*)    All other components within normal limits  URINALYSIS, COMPLETE (UACMP) WITH MICROSCOPIC - Abnormal; Notable for the following components:   Color, Urine YELLOW (*)    APPearance CLOUDY (*)    Protein, ur 30 (*)    Squamous Epithelial / LPF 0-5 (*)    All other components within normal limits  CBC  TROPONIN I  POCT PREGNANCY, URINE  CBG MONITORING, ED  POC URINE PREG, ED   ____________________________________________  EKG  ED ECG REPORT I, Rebecka Apley, the attending physician, personally viewed and interpreted this ECG.   Date: 02/11/2017  EKG Time: 2244  Rate: 105  Rhythm: sinus tachycardia  Axis: normal  Intervals:none  ST&T Change: none  ____________________________________________  RADIOLOGY  Dg Chest 2 View  Result Date: 02/12/2017 CLINICAL DATA:  Weakness and shortness of breath. EXAM: CHEST  2 VIEW COMPARISON:  10/23/2011 FINDINGS: Cardiomediastinal silhouette is normal. Mediastinal contours appear intact. There is no evidence of focal airspace consolidation, pleural effusion or pneumothorax. Osseous structures are without acute abnormality. Soft tissues are grossly normal. IMPRESSION: No active cardiopulmonary disease. Electronically Signed   By: Ted Mcalpine M.D.   On: 02/12/2017 04:36    ____________________________________________   PROCEDURES  Procedure(s) performed: None  Procedures  Critical Care performed: No  ____________________________________________   INITIAL IMPRESSION / ASSESSMENT AND PLAN  / ED COURSE  As part of my medical decision making, I reviewed the following data within the electronic MEDICAL RECORD NUMBER Notes from prior ED visits and [place] Controlled Substance Database   This is a 21 year old female who comes into the hospital today with some generalized weakness and some difficulty catching her breath.  The patient seems very anxious and keeps looking up to the monitor.  The patient had a BMP and a CBC performed and they were both unremarkable.  The patient's urinalysis was also negative.  She had no abnormal respirations and her lungs were clear to auscultation.  I will give the patient a liter of normal saline as well as a dose of oral Ativan.  She will be reassessed.     The patient's blood work is unremarkable and her CXR is also unremarkable. Please follow up with your primary care physician.  ____________________________________________   FINAL CLINICAL IMPRESSION(S) / ED DIAGNOSES  Final diagnoses:  Generalized weakness  Shortness of breath     ED Discharge Orders    None  Note:  This document was prepared using Dragon voice recognition software and may include unintentional dictation errors.    Rebecka ApleyWebster, Allison P, MD 02/12/17 [phone]

## 2017-03-14 ENCOUNTER — Other Ambulatory Visit: Payer: Self-pay | Admitting: Family Medicine

## 2017-03-14 ENCOUNTER — Other Ambulatory Visit: Payer: Self-pay | Admitting: *Deleted

## 2017-03-14 MED ORDER — NORETHIN ACE-ETH ESTRAD-FE 1-20 MG-MCG PO TABS: 1.0000 | ORAL_TABLET | Freq: Every day | ORAL | 0 refills | Status: DC

## 2017-03-14 NOTE — Telephone Encounter (Signed)
No recent or future appts., please advise  

## 2017-03-14 NOTE — Telephone Encounter (Signed)
appt scheduled and med refilled 

## 2017-03-14 NOTE — Telephone Encounter (Signed)
Please schedule annual exam with pap and refill until then

## 2017-03-20 ENCOUNTER — Ambulatory Visit: Payer: 59 | Admitting: Family Medicine

## 2017-03-20 DIAGNOSIS — Z0289 Encounter for other administrative examinations: Secondary | ICD-10-CM

## 2017-03-26 ENCOUNTER — Ambulatory Visit (INDEPENDENT_AMBULATORY_CARE_PROVIDER_SITE_OTHER): Payer: 59 | Admitting: Family Medicine

## 2017-03-26 ENCOUNTER — Encounter: Payer: Self-pay | Admitting: Family Medicine

## 2017-03-26 ENCOUNTER — Other Ambulatory Visit (HOSPITAL_COMMUNITY)
Admission: RE | Admit: 2017-03-26 | Discharge: 2017-03-26 | Disposition: A | Discharge status: Home / Self Care | Payer: 59 | Source: Ambulatory Visit | Attending: Family Medicine | Admitting: Family Medicine

## 2017-03-26 VITALS — BP 126/68 | HR 107 | Temp 98.1°F | Ht 68.0 in | Wt 204.8 lb

## 2017-03-26 DIAGNOSIS — Z0001 Encounter for general adult medical examination with abnormal findings: Secondary | ICD-10-CM

## 2017-03-26 DIAGNOSIS — Z01419 Encounter for gynecological examination (general) (routine) without abnormal findings: Secondary | ICD-10-CM | POA: Diagnosis not present

## 2017-03-26 DIAGNOSIS — I456 Pre-excitation syndrome: Secondary | ICD-10-CM

## 2017-03-26 DIAGNOSIS — Z23 Encounter for immunization: Secondary | ICD-10-CM

## 2017-03-26 DIAGNOSIS — F419 Anxiety disorder, unspecified: Secondary | ICD-10-CM | POA: Diagnosis not present

## 2017-03-26 DIAGNOSIS — Z0000 Encounter for general adult medical examination without abnormal findings: Secondary | ICD-10-CM

## 2017-03-26 DIAGNOSIS — N946 Dysmenorrhea, unspecified: Secondary | ICD-10-CM

## 2017-03-26 MED ORDER — NORETHIN ACE-ETH ESTRAD-FE 1-20 MG-MCG PO TABS: 1.0000 | ORAL_TABLET | Freq: Every day | ORAL | 3 refills | Status: DC

## 2017-03-26 NOTE — Assessment & Plan Note (Addendum)
Pt is worried anxiety will set her off  occ exp skipped beats  Watches her heart rate   No longer sees cardiologist  Referral offered and she declines

## 2017-03-26 NOTE — Progress Notes (Signed)
Subjective:    Patient ID: Abigail Pham, female    DOB: 08-Mar-1996, 22 y.o.   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)  Next few semesters will be harder   Work- going ok also - works at [employer] plus up in [employer] to ED in December - had anxiety (given one dose of ativan)  Labs were normal  Anxiety runs in the family  She gets very anxious and "shuts down" - on a daily basis  Lacks motivation because she is scared to do thing  Worried she will set off her WPW  Over thinks things as well  Not full blown panic attacks   For anxiety and adhd was on straterra and remeron in the past adhd is better   Now has insurance   wPW- still symptomatic at times  Was given opt of ablation -decided against it years ago  Never discussed medications   BP Readings from Last 3 Encounters:  03/26/17 126/68  02/12/17 119/79  02/10/16 104/66   Pulse Readings from Last 3 Encounters:  03/26/17 (!) 107  02/12/17 85  02/10/16 83      Wt Readings from Last 3 Encounters:  03/26/17 204 lb 12 oz (92.9 kg)  02/11/17 198 lb (89.8 kg)  02/10/16 189 lb (85.7 kg)  not enough exercise  Eating right most of the time  Mainly drinks water  31.13 kg/m   Flu shot -? Will get today   HIV screening -declines  Not interested in std screening   Tetanus shot 2011   HPV vaccine -is interested if it is covered   Pap test -has not had yet  Hx of dysmenorrhea  On Junel fe 1/20 - periods are controlled on this  She missed some pills and this made her cycle irregular  Started back Sunday- doing well now  Had menses last week  Monogamous -no hx of std      Chemistry      Component Value Date/Time   NA 139 02/11/2017 2242   K 3.6 02/11/2017 2242   CL 105 02/11/2017 2242   CO2 22 02/11/2017 2242   BUN 13 02/11/2017 2242   CREATININE 0.80 02/11/2017 2242      Component Value  Date/Time   CALCIUM 9.3 02/11/2017 2242      Lab Results  Component Value Date   WBC 9.1 02/11/2017   HGB 15.0 02/11/2017   HCT 43.9 02/11/2017   MCV 86.1 02/11/2017   PLT 249 02/11/2017    Patient Active Problem List   Diagnosis Date Noted  . Anxiety-like symptoms 03/26/2017  . Encounter for annual routine gynecological examination 03/26/2017  . Routine general medical examination at a health care facility 02/10/2016  . Poor concentration 11/06/2013  . History of ADHD 11/06/2013  . Dysmenorrhea 10/20/2012  . Screen for STD (sexually transmitted disease) 10/20/2012  . WPW (Wolff-Parkinson-White syndrome) 11/09/2010   Past Medical History:  Diagnosis Date  . Heart murmur of newborn   . History of chicken pox    around age 89  . WPW (Wolff-Parkinson-White syndrome)    Hosp at [facility] at one month of age for Tachyarrrythmia and pulmonary hemorrhage   No past surgical history on file. Social History   Tobacco Use  . Smoking status: Passive Smoke Exposure - Never Smoker  . Smokeless tobacco: Never Used  .  Tobacco comment: pt smokes vapors occ with nicotine 3 mg   Substance Use Topics  . Alcohol use: Yes    Alcohol/week: 0.0 oz    Comment: occ  . Drug use: No   Family History  Problem Relation Age of Onset  . Stroke Paternal Grandfather   . High blood pressure Paternal Grandfather   . High blood pressure Father    Allergies  Allergen Reactions  . Other Other (See Comments)    Antihistamines or other stimulants-heart races   No current outpatient medications on file prior to visit.   No current facility-administered medications on file prior to visit.      Review of Systems  Constitutional: Positive for fatigue. Negative for activity change, appetite change, fever and unexpected weight change.  HENT: Negative for congestion, ear pain, rhinorrhea, sinus pressure and sore throat.   Eyes: Negative for pain, redness and visual disturbance.  Respiratory: Negative for  cough, shortness of breath and wheezing.   Cardiovascular: Negative for chest pain and palpitations.  Gastrointestinal: Negative for abdominal pain, blood in stool, constipation and diarrhea.  Endocrine: Negative for polydipsia and polyuria.  Genitourinary: Negative for dysuria, frequency, menstrual problem, pelvic pain and urgency.  Musculoskeletal: Negative for arthralgias, back pain and myalgias.  Skin: Negative for pallor and rash.  Allergic/Immunologic: Negative for environmental allergies.  Neurological: Negative for dizziness, syncope and headaches.  Hematological: Negative for adenopathy. Does not bruise/bleed easily.  Psychiatric/Behavioral: Negative for decreased concentration, dysphoric mood, self-injury and sleep disturbance. The patient is nervous/anxious.        Objective:   Physical Exam  Constitutional: She appears well-developed and well-nourished. No distress.  obese and well appearing   HENT:  Head: Normocephalic and atraumatic.  Right Ear: External ear normal.  Left Ear: External ear normal.  Mouth/Throat: Oropharynx is clear and moist.  Eyes: Conjunctivae and EOM are normal. Pupils are equal, round, and reactive to light. No scleral icterus.  Neck: Normal range of motion. Neck supple. No JVD present. Carotid bruit is not present. No thyromegaly present.  Cardiovascular: Regular rhythm, normal heart sounds and intact distal pulses. Exam reveals no gallop.  No murmur heard. Rate in low 90s on exam   Pulmonary/Chest: Effort normal and breath sounds normal. No respiratory distress. She has no wheezes. She exhibits no tenderness.  Abdominal: Soft. Bowel sounds are normal. She exhibits no distension, no abdominal bruit and no mass. There is no tenderness.  Genitourinary: No breast swelling, tenderness, discharge or bleeding.  Genitourinary Comments: Breast exam: No mass, nodules, thickening, tenderness, bulging, retraction, inflamation, nipple discharge or skin changes  noted.  No axillary or clavicular LA.    (dense tissue)         Anus appears normal w/o hemorrhoids or masses     External genitalia : nl appearance and hair distribution/no lesions     Urethral meatus : nl size, no lesions or prolapse     Urethra: no masses, tenderness or scarring    Bladder : no masses or tenderness     Vagina: nl general appearance, no discharge or  Lesions, no significant cystocele  or rectocele     Cervix: no lesions/ discharge or friability    Uterus: nl size, contour, position, and mobility (not fixed) , non tender    Adnexa : no masses, tenderness, enlargement or nodularity        Musculoskeletal: Normal range of motion. She exhibits no edema or tenderness.  Lymphadenopathy:    She has no cervical  adenopathy.  Neurological: She is alert. She has normal reflexes. No cranial nerve deficit. She exhibits normal muscle tone. Coordination normal.  Skin: Skin is warm and dry. No rash noted. No erythema. No pallor.  Solar lentigines diffusely   Psychiatric: Her speech is normal and behavior is normal. Her mood appears anxious. Her affect is not blunt and not labile. Thought content is not paranoid. Cognition and memory are normal. She does not exhibit a depressed mood. She expresses no homicidal and no suicidal ideation.  Affect is generally dull  Attentiveness is fair           Assessment & Plan:   Problem List Items Addressed This Visit      Cardiovascular and Mediastinum   WPW (Wolff-Parkinson-White syndrome)    Pt is worried anxiety will set her off  occ exp skipped beats  Watches her heart rate   No longer sees cardiologist  Referral offered and she declines         Genitourinary   Dysmenorrhea    Much improved on OC  Exam today  OC refilled  Will continue this  Declines std screening         Other   Anxiety-like symptoms    Pt states on /off as a kid  No longer tx for ADD Ref to counseling  After she begins this  will f/u to disc poss medication as needed Had ED visit -  ? Panic attack  Reviewed hospital records, lab results and studies in detail        Relevant Orders   Ambulatory referral to Psychology   Encounter for annual routine gynecological examination    First pap and exam  Declines any std screening  Oc refilled       Relevant Orders   Cytology - PAP   Routine general medical examination at a health care facility - Primary    Reviewed health habits including diet and exercise and skin cancer prevention Reviewed appropriate screening tests for age  Also reviewed health mt list, fam hx and immunization status , as well as social and family history   See HPI Rev labs from last ED visit  Would like to get tsh in the future in light of anxiety -she is apprehensive to do more lab work       Other Visit Diagnoses    Need for influenza vaccination       Relevant Orders   Flu Vaccine QUAD 6+ mos PF IM (Fluarix Quad PF) (Completed)

## 2017-03-26 NOTE — Patient Instructions (Addendum)
If you want to loose weight-=stay away from juice  Keep drinking water Try to get most of your carbohydrates from produce (with the exception of white potatoes)  Eat less bread/pasta/rice/snack foods/cereals/sweets and other items from the middle of the grocery store (processed carbs)  When you go home= look at your insurance benefits book to see if HPV vaccine is covered- if it is call us for a nurse visit to schedule it  If not - you may be able to get it at the health dept   Let us know when you are ready for a cardiology referral   We will refer you to a counselor for anxiety-see Shirlee LimerickMarion   Think about exercise program to help mood/energy   Follow up with me after you have started counseling - we will discuss medication for anxiety if needed

## 2017-03-27 LAB — CYTOLOGY - PAP
ADEQUACY: ABSENT
Diagnosis: NEGATIVE

## 2017-03-27 NOTE — Assessment & Plan Note (Signed)
Pt states on /off as a kid  No longer tx for ADD Ref to counseling  After she begins this will f/u to disc poss medication as needed Had ED visit -  ? Panic attack  Reviewed hospital records, lab results and studies in detail

## 2017-03-27 NOTE — Assessment & Plan Note (Signed)
Reviewed health habits including diet and exercise and skin cancer prevention Reviewed appropriate screening tests for age  Also reviewed health mt list, fam hx and immunization status , as well as social and family history   See HPI Rev labs from last ED visit  Would like to get tsh in the future in light of anxiety -she is apprehensive to do more lab work

## 2017-03-27 NOTE — Assessment & Plan Note (Signed)
First pap and exam  Declines any std screening  Oc refilled

## 2017-03-27 NOTE — Assessment & Plan Note (Signed)
Much improved on OC  Exam today  OC refilled  Will continue this  Declines std screening

## 2017-04-01 ENCOUNTER — Ambulatory Visit: Payer: 59 | Admitting: Psychology

## 2017-04-03 ENCOUNTER — Ambulatory Visit: Payer: 59 | Admitting: Psychology

## 2017-04-03 DIAGNOSIS — F411 Generalized anxiety disorder: Secondary | ICD-10-CM

## 2017-07-09 ENCOUNTER — Ambulatory Visit: Payer: Self-pay | Admitting: Internal Medicine

## 2017-07-09 ENCOUNTER — Encounter: Payer: Self-pay | Admitting: Internal Medicine

## 2017-07-09 VITALS — BP 110/80 | HR 78 | Wt 206.0 lb

## 2017-07-09 DIAGNOSIS — B9689 Other specified bacterial agents as the cause of diseases classified elsewhere: Secondary | ICD-10-CM

## 2017-07-09 DIAGNOSIS — R300 Dysuria: Secondary | ICD-10-CM

## 2017-07-09 DIAGNOSIS — N760 Acute vaginitis: Secondary | ICD-10-CM

## 2017-07-09 DIAGNOSIS — R350 Frequency of micturition: Secondary | ICD-10-CM

## 2017-07-09 DIAGNOSIS — R3989 Other symptoms and signs involving the genitourinary system: Secondary | ICD-10-CM

## 2017-07-09 LAB — POC URINALSYSI DIPSTICK (AUTOMATED)
Bilirubin, UA: NEGATIVE
Glucose, UA: NEGATIVE
KETONES UA: NEGATIVE
NITRITE UA: NEGATIVE
PROTEIN UA: NEGATIVE
RBC UA: NEGATIVE
Spec Grav, UA: 1.02 (ref 1.010–1.025)
Urobilinogen, UA: 0.2 E.U./dL
pH, UA: 7 (ref 5.0–8.0)

## 2017-07-09 MED ORDER — METRONIDAZOLE 0.75 % EX GEL: 1.0000 "application " | Freq: Two times a day (BID) | CUTANEOUS | 0 refills | Status: DC

## 2017-07-09 NOTE — Progress Notes (Signed)
HPI  Pt presents to the clinic today with c/o urinary frequency, dysuria and bladder pressure. She reports this started 1-2 weeks ago. She denies urgency, blood in her urine, vaginal discharge, odor or abnormal bleeding. She denies fever, chills, nausea or flank pain. She has tried AZO and Miconazole OTC with minimal relief. She is sexually active. Her LMP was 06/07/16. She is on OCP's.   Review of Systems  Past Medical History:  Diagnosis Date  . Heart murmur of newborn   . History of chicken pox    around age 66  . WPW (Wolff-Parkinson-White syndrome)    Hosp at [facility] at one month of age for Tachyarrrythmia and pulmonary hemorrhage    Family History  Problem Relation Age of Onset  . Stroke Paternal Grandfather   . High blood pressure Paternal Grandfather   . High blood pressure Father     Social History   Socioeconomic History  . Marital status: Single    Spouse name: Not on file  . Number of children: Not on file  . Years of education: Not on file  . Highest education level: Not on file  Occupational History  . Not on file  Social Needs  . Financial resource strain: Not on file  . Food insecurity:    Worry: Not on file    Inability: Not on file  . Transportation needs:    Medical: Not on file    Non-medical: Not on file  Tobacco Use  . Smoking status: Passive Smoke Exposure - Never Smoker  . Smokeless tobacco: Never Used  . Tobacco comment: pt smokes vapors occ with nicotine 3 mg   Substance and Sexual Activity  . Alcohol use: Yes    Alcohol/week: 0.0 oz    Comment: occ  . Drug use: No  . Sexual activity: Yes    Birth control/protection: Pill  Lifestyle  . Physical activity:    Days per week: Not on file    Minutes per session: Not on file  . Stress: Not on file  Relationships  . Social connections:    Talks on phone: Not on file    Gets together: Not on file    Attends religious service: Not on file    Active member of club or organization: Not on file    Attends meetings of clubs or organizations: Not on file    Relationship status: Not on file  . Intimate partner violence:    Fear of current or ex partner: Not on file    Emotionally abused: Not on file    Physically abused: Not on file    Forced sexual activity: Not on file  Other Topics Concern  . Not on file  Social History Narrative  . Not on file    Allergies  Allergen Reactions  . Other Other (See Comments)    Antihistamines or other stimulants-heart races     Constitutional: Denies fever, malaise, fatigue, headache or abrupt weight changes.   GU: Pt reports bladder pressure, frequency and pain with urination. Denies burning sensation, blood in urine, odor or discharge.   No other specific complaints in a complete review of systems (except as listed in HPI above).    Objective:   Physical Exam  BP 110/80   Pulse 78   Wt 206 lb (93.4 kg)   BMI 31.32 kg/m   Wt Readings from Last 3 Encounters:  03/26/17 204 lb 12 oz (92.9 kg)  02/11/17 198 lb (89.8 kg)  02/10/16 189  lb (85.7 kg)    General: Appears her stated age, well developed, well nourished in NAD. Abdomen: Soft and nontender. Normal bowel sounds. No distention or masses noted. No CVA tenderness.        Assessment & Plan:   Bladder Pressure, Frequency, Dysuria:  Urinalysis: 1+ leuks Will send urine culture Wet prep:  +clue cells eRx sent if for Metrogel  BID x 5 days OK to take AZO OTC Drink plenty of fluids  RTC as needed or if symptoms persist. Nicki Reaper, NP

## 2017-07-09 NOTE — Patient Instructions (Signed)

## 2017-07-10 LAB — URINE CULTURE
MICRO NUMBER: 90525249
Result:: NO GROWTH
SPECIMEN QUALITY: ADEQUATE

## 2017-07-15 ENCOUNTER — Telehealth: Payer: Self-pay

## 2017-07-15 NOTE — Telephone Encounter (Signed)
Spoke with patient briefly about her financial assistance needs.  She is questioning Medicaid services and will need to connect with her local department of social services to discuss resources and begin application process.  Patient had to get off the phone quickly before we could discuss.  She will call me back tomorrow around 11am to discuss further.

## 2017-07-15 NOTE — Telephone Encounter (Signed)
  This was not converted to a telephone encounter.  Encounter created on 07/15/17.   Copied from CRM [id number]. Topic: Quick Communication - See Telephone Encounter >> Jul 12, 2017  2:18 PM Herby Abraham C wrote: CRM for notification. See Telephone encounter for: 07/12/17.  Pt called in for assistance. Pt says that she spoke with someone a while ago about financial assistance. Pt would like to know if provider is able to direct her to any resources that may be available to her for assistance. Please advise  [phone]

## 2017-07-23 NOTE — Telephone Encounter (Signed)
Pt returned call - she will be working tomorrow, and is requesting a call back Thursday [phone]

## 2017-07-26 NOTE — Telephone Encounter (Signed)
Patient calling back [phone]

## 2017-07-26 NOTE — Telephone Encounter (Signed)
Called patient back, she couldn't speak at that time and asked if she could call me right back after maintenance leaves her apartment.

## 2017-07-30 NOTE — Telephone Encounter (Signed)
Copied from CRM [id number]. Topic: General - Other >> Jul 26, 2017 12:09 PM Marylen Ponto wrote: Reason for CRM: Pt request to speak with [facility]. Pt requesting a return call at [phone]

## 2017-07-31 NOTE — Telephone Encounter (Signed)
Called patient but call went straight to VM.  I could not leave a message because voicemail was full.

## 2017-07-31 NOTE — Telephone Encounter (Signed)
Spoke with patient.  Patient has been under father's insurance but no longer can continue.  She is working part time and does not have benefits and needs support with insurance and cost options. Discussed options available to her for support:  1.  [place] financial Assistance Program - patient is coming by to pick up an application packet and contact them to discuss support options.  2.  Referred her to [facility] department of social service - Patient is going to call and discuss insurance options and whether she could qualify for medicaid.  Also, discuss medication cost management assistance to help her with current prescription cost.  3.  [facility] - patient made aware of this option and will consider if needed after meeting with a counselor with social services.    Patient thanks me for this information and knows she can call me back if she has any further questions or concerns.

## 2017-07-31 NOTE — Telephone Encounter (Signed)
Thank you :)

## 2017-08-14 ENCOUNTER — Ambulatory Visit: Payer: Self-pay

## 2017-08-14 NOTE — Telephone Encounter (Signed)
Pt calling and tried to schedule appt to be seen in office tomorrow. Pt states that based on financial situation she can not go to the ED or Urgent Care. Pt advised that based on the previous recommendations of the triage nurse she still needed to to seek treatment tonight in ED or Urgent Care. Pt verbalized understanding.

## 2017-08-14 NOTE — Telephone Encounter (Signed)
Phone call from pt with c/o shortness of breath and productive cough.  Reported she had onset of sore throat on Saturday, and it subsided on Monday.  Stated she started having cough on Monday; reported the cough is frequent, persistent, and productive of clear to yellow mucus.  C/o tasting blood when coughing, but not able to see any visible blood in phlegm.  Also, c/o wheezing with each breath.  C/o "chest tightness" in the center of her chest, and increased chest pain with coughing. C/o shortness of breath with rest and activity.  Has been using Robitussin with honey, Flonase, and a humidifier.  Advised she should go to UC or ER today due to the severity of symptoms.  Advised to have another adult drive her.  Pt. Verb. Understanding.  Agrees with plan.              Reason for Disposition . Difficulty breathing  Answer Assessment - Initial Assessment Questions 1. ONSET: "When did the cough begin?"      Monday AM  2. SEVERITY: "How bad is the cough today?"      Frequent persistent 3. RESPIRATORY DISTRESS: "Describe your breathing."      C/o shortness of breath with rest or activity 4. FEVER: "Do you have a fever?" If so, ask: "What is your temperature, how was it measured, and when did it start?"     C/o getting hot; doesn't have a thermometer ; denied chills 5. SPUTUM: "Describe the color of your sputum" (clear, white, yellow, green)     Clear to yellow 6. HEMOPTYSIS: "Are you coughing up any blood?" If so ask: "How much?" (flecks, streaks, tablespoons, etc.)    No visible blood in sputum; c/o tasting the blood with coughing 7. CARDIAC HISTORY: "Do you have any history of heart disease?" (e.g., heart attack, congestive heart failure)      Hx of WPW syndrome 8. LUNG HISTORY: "Do you have any history of lung disease?"  (e.g., pulmonary embolus, asthma, emphysema)     No asthma 9. PE RISK FACTORS: "Do you have a history of blood clots?" (or: recent major surgery, recent prolonged travel,  bedridden )     N/a 10. OTHER SYMPTOMS: "Do you have any other symptoms?" (e.g., runny nose, wheezing, chest pain)       Has sore throat Sat-Mon; now gone; wheezing with each breath; shortness of breath; hard to get breath with going up the stairs; c/o tightness and pain in center of chest  11. PREGNANCY: "Is there any chance you are pregnant?" "When was your last menstrual period?"      LMP 5/27  12. TRAVEL: "Have you traveled out of the country in the last month?" (e.g., travel history, exposures)       No  Protocols used: COUGH - ACUTE PRODUCTIVE-A-AH

## 2017-08-15 NOTE — Telephone Encounter (Signed)
Unable to reach pt by phone and v/m is full.

## 2017-08-16 NOTE — Telephone Encounter (Signed)
Called pt and no answer and pt's VM box is still full

## 2017-08-16 NOTE — Telephone Encounter (Signed)
Please check in with her if she answers the phone-thanks

## 2017-08-22 NOTE — Telephone Encounter (Signed)
Called and pt's VM box was still full, sent to Dr. Milinda Antisower so she is aware we were never able to reach pt

## 2017-10-03 ENCOUNTER — Ambulatory Visit: Payer: Self-pay

## 2017-10-03 NOTE — Telephone Encounter (Signed)
Incoming call from patient with complaint of lower back pain that started last Monday afternoon.  Patient states it radiates from middle of spine down to legs sometimes arms.  Rates the pain moderate during the day. Severe  At nite time.  Stiffness and achy is considered constant. Sharpe pain is intermittent. Patient states she was reaching down to pick up something when she felt initial sharp pain in back.  Patient states she constantly moves her hands                        at work  Does yoga and stretching.  Patient has taken hydrocodone, tylenol, Ibuprofen which none of the three have provided relief.  States numbness occurs at time.  But more achy.  Patient states she experienced burning with urinatioin one time last night.  Today she has not.   Last menstrual  cycle was July 14th.  Provided care advice voiced understanding.   Schedule and appointment for Friday 10/04/17 at 4:00pm  Patient voiced understanding      Reason for Disposition . [1] MODERATE back pain (e.g., interferes with normal activities) AND [2] present > 3 days  Answer Assessment - Initial Assessment Questions 1. ONSET: "When did the pain begin?"      Monday afternoon 2. LOCATION: "Where does it hurt?" (upper, mid or lower back)     Lower back middle of spine radiates down to leg sometimes in abdomin, arms  3. SEVERITY: "How bad is the pain?"  (e.g., Scale 1-10; mild, moderate, or severe)   - MILD (1-3): doesn't interfere with normal activities    - MODERATE (4-7): interferes with normal activities or awakens from sleep    - SEVERE (8-10): excruciating pain, unable to do any normal activities      Moderate, severe at nite time 4. PATTERN: "Is the pain constant?" (e.g., yes, no; constant, intermittent)      Stiffness achy constant . Sharp pain, intermitten 5. RADIATION: "Does the pain shoot into your legs or elsewhere?"      See note 6. CAUSE:  "What do you think is causing the back pain?"      Reached down to pick up  something 7. BACK OVERUSE:  "Any recent lifting of heavy objects, strenuous work or exercise?"     Moving hands at work, yoga stretching  8. MEDICATIONS: "What have you taken so far for the pain?" (e.g., nothing, acetaminophen, NSAIDS)     Hydrocodone , tylenol Iast  Night ibuprohen 9. NEUROLOGIC SYMPTOMS: "Do you have any weakness, numbness, or problems with bowel/bladder control?"     Yes numbness at times more achy 10. OTHER SYMPTOMS: "Do you have any other symptoms?" (e.g., fever, abdominal pain, burning with urination, blood in urine)       Burning last not. Not this am 11. PREGNANCY: "Is there any chance you are pregnant?" (e.g., yes, no; LMP)       **[id number]*  Protocols used: BACK PAIN-A-AH

## 2017-10-04 ENCOUNTER — Ambulatory Visit: Payer: Self-pay | Admitting: Internal Medicine

## 2017-10-04 ENCOUNTER — Encounter: Payer: Self-pay | Admitting: Internal Medicine

## 2017-10-04 ENCOUNTER — Ambulatory Visit (INDEPENDENT_AMBULATORY_CARE_PROVIDER_SITE_OTHER)
Admission: RE | Admit: 2017-10-04 | Discharge: 2017-10-04 | Disposition: A | Discharge status: Home / Self Care | Payer: Self-pay | Source: Ambulatory Visit | Attending: Internal Medicine | Admitting: Internal Medicine

## 2017-10-04 ENCOUNTER — Encounter: Payer: Self-pay | Admitting: Family Medicine

## 2017-10-04 VITALS — BP 124/78 | HR 92 | Temp 98.2°F | Wt 210.0 lb

## 2017-10-04 DIAGNOSIS — M5441 Lumbago with sciatica, right side: Secondary | ICD-10-CM

## 2017-10-04 DIAGNOSIS — M5442 Lumbago with sciatica, left side: Secondary | ICD-10-CM

## 2017-10-04 MED ORDER — METHOCARBAMOL 500 MG PO TABS: 500.0000 mg | ORAL_TABLET | Freq: Three times a day (TID) | ORAL | 0 refills | Status: DC | PRN

## 2017-10-04 NOTE — Progress Notes (Signed)
Subjective:    Patient ID: Abigail Pham, female    DOB: [birth year], 22 y.o.   MRN: [id number]  HPI  Pt presents to the clinic today with c/o low back pain. She reports 5 days ago, she bent over to pick up a bag, and all of a sudden felt a sharp pain in her lower legs. The pain comes and goes. It does not radiate but she has intermittent numbness in her legs. She denies tingling or weakness. She denies loss of bowel or bladder. She denies any previous injury to the area. She has tried Ibuprofen, Hydrocodone and Tylenol as needed with minimal relief.  Review of Systems      Past Medical History:  Diagnosis Date  . Heart murmur of newborn   . History of chicken pox    around age 89  . WPW (Wolff-Parkinson-White syndrome)    Hosp at [facility] at one month of age for Tachyarrrythmia and pulmonary hemorrhage    Current Outpatient Medications  Medication Sig Dispense Refill  . norethindrone-ethinyl estradiol (JUNEL FE 1/20) 1-20 MG-MCG tablet Take 1 tablet by mouth daily. 84 tablet 3  . methocarbamol (ROBAXIN) 500 MG tablet Take 1 tablet (500 mg total) by mouth every 8 (eight) hours as needed for muscle spasms. 15 tablet 0   No current facility-administered medications for this visit.     Allergies  Allergen Reactions  . Other Other (See Comments)    Antihistamines or other stimulants-heart races    Family History  Problem Relation Age of Onset  . Stroke Paternal Grandfather   . High blood pressure Paternal Grandfather   . High blood pressure Father     Social History   Socioeconomic History  . Marital status: Single    Spouse name: Not on file  . Number of children: Not on file  . Years of education: Not on file  . Highest education level: Not on file  Occupational History  . Not on file  Social Needs  . Financial resource strain: Not on file  . Food insecurity:    Worry: Not on file    Inability: Not on file  . Transportation needs:    Medical: Not on file   Non-medical: Not on file  Tobacco Use  . Smoking status: Passive Smoke Exposure - Never Smoker  . Smokeless tobacco: Never Used  . Tobacco comment: pt smokes vapors occ with nicotine 3 mg   Substance and Sexual Activity  . Alcohol use: Yes    Alcohol/week: 0.0 oz    Comment: occ  . Drug use: No  . Sexual activity: Yes    Birth control/protection: Pill  Lifestyle  . Physical activity:    Days per week: Not on file    Minutes per session: Not on file  . Stress: Not on file  Relationships  . Social connections:    Talks on phone: Not on file    Gets together: Not on file    Attends religious service: Not on file    Active member of club or organization: Not on file    Attends meetings of clubs or organizations: Not on file    Relationship status: Not on file  . Intimate partner violence:    Fear of current or ex partner: Not on file    Emotionally abused: Not on file    Physically abused: Not on file    Forced sexual activity: Not on file  Other Topics Concern  . Not on file  Social History Narrative  . Not on file     Constitutional: Denies fever, malaise, fatigue, headache or abrupt weight changes.  Gastrointestinal: Denies abdominal pain, bloating, constipation, diarrhea or blood in the stool.  GU: Denies urgency, frequency, pain with urination, burning sensation, blood in urine, odor or discharge. Musculoskeletal: Pt reports low back pain. Denies difficulty with gait, or joint swelling.  Neurological: Pt reports numbness of bilateral lower extremities. Denies dizziness, difficulty with memory, difficulty with speech or problems with balance and coordination.    No other specific complaints in a complete review of systems (except as listed in HPI above).  Objective:   Physical Exam  BP 124/78   Pulse 92   Temp 98.2 F (36.8 C) (Oral)   Wt 210 lb (95.3 kg)   LMP 09/23/2017   SpO2 98%   BMI 31.93 kg/m  Wt Readings from Last 3 Encounters:  10/04/17 210 lb (95.3  kg)  07/09/17 206 lb (93.4 kg)  03/26/17 204 lb 12 oz (92.9 kg)    General: Appears her stated age, obese,  in NAD. Skin: Warm, dry and intact. No rashes, lesions or ulcerations noted. Musculoskeletal: Normal flexion, extension and rotation of the spine. No bony tenderness noted over the spine. No difficulty with gait. She does have some difficulty going from a sitting to a standing position. She is able to walk on tiptoes but not heels. Neurological: Alert and oriented. Positive SLR bilaterally.    BMET    Component Value Date/Time   NA 139 02/11/2017 2242   K 3.6 02/11/2017 2242   CL 105 02/11/2017 2242   CO2 22 02/11/2017 2242   GLUCOSE 105 (H) 02/11/2017 2242   BUN 13 02/11/2017 2242   CREATININE 0.80 02/11/2017 2242   CALCIUM 9.3 02/11/2017 2242   GFRNONAA >60 02/11/2017 2242   GFRAA >60 02/11/2017 2242    Lipid Panel  No results found for: CHOL, TRIG, HDL, CHOLHDL, VLDL, LDLCALC  CBC    Component Value Date/Time   WBC 9.1 02/11/2017 2242   RBC 5.09 02/11/2017 2242   HGB 15.0 02/11/2017 2242   HCT 43.9 02/11/2017 2242   PLT 249 02/11/2017 2242   MCV 86.1 02/11/2017 2242   MCH 29.5 02/11/2017 2242   MCHC 34.2 02/11/2017 2242   RDW 12.6 02/11/2017 2242    Hgb A1C No results found for: HGBA1C          Assessment & Plan:   Acute Low Back Pain with Bilateral Sciatica:  Xray lumbar spine today Encouraged heat, massage She declines Pred Taper eRx for Methocarbamol 500 mg TID prn- sedation caution given May need PT vs ortho referral  Will follow up after xray, return precautions discussed Nicki Reaper, NP

## 2017-10-04 NOTE — Patient Instructions (Signed)

## 2017-10-11 ENCOUNTER — Ambulatory Visit: Payer: Self-pay | Admitting: Family Medicine

## 2017-11-17 ENCOUNTER — Other Ambulatory Visit: Payer: Self-pay | Admitting: Internal Medicine

## 2018-04-07 ENCOUNTER — Other Ambulatory Visit: Payer: Self-pay | Admitting: Family Medicine

## 2018-04-07 NOTE — Telephone Encounter (Signed)
Called patient but could not leave a message.  Called emergency contact's phone number and it is not a working number. Sent patient mychart message. Need to schedule Physical before refilling medication. Last CPE was on 03/26/2017 with Dr. Milinda Antis. 2 acute visits since then in 2019 with other providers.

## 2018-04-11 NOTE — Telephone Encounter (Signed)
See my chart message

## 2018-06-13 ENCOUNTER — Telehealth: Payer: Self-pay | Admitting: Family Medicine

## 2018-06-13 MED ORDER — NORETHIN ACE-ETH ESTRAD-FE 1-20 MG-MCG PO TABS: 1.0000 | ORAL_TABLET | Freq: Every day | ORAL | 0 refills | Status: DC

## 2018-06-13 NOTE — Telephone Encounter (Signed)
See last mychart message pt stated she didn't have insurance last time we tried to schedule f/u, please advise if okay to refill med

## 2018-06-13 NOTE — Telephone Encounter (Signed)
Pt need new proscription for Junel   Sent to [facility]

## 2018-06-13 NOTE — Telephone Encounter (Signed)
Refilled once  Please schedule virtual visit

## 2018-06-16 NOTE — Telephone Encounter (Signed)
Patient has been notified. Please schedule patient for WebEx

## 2018-06-17 NOTE — Telephone Encounter (Signed)
Pt returned your call she will call back when she gets next week schedule to make appointment

## 2018-06-17 NOTE — Telephone Encounter (Signed)
I left a message on patient's voice mail to return my call.  Patient needs to schedule a Webex with Dr.Tower.

## 2018-07-07 ENCOUNTER — Ambulatory Visit (INDEPENDENT_AMBULATORY_CARE_PROVIDER_SITE_OTHER): Payer: Self-pay | Admitting: Family Medicine

## 2018-07-07 ENCOUNTER — Encounter: Payer: Self-pay | Admitting: Family Medicine

## 2018-07-07 ENCOUNTER — Other Ambulatory Visit: Payer: Self-pay

## 2018-07-07 DIAGNOSIS — Z3041 Encounter for surveillance of contraceptive pills: Secondary | ICD-10-CM

## 2018-07-07 DIAGNOSIS — F419 Anxiety disorder, unspecified: Secondary | ICD-10-CM

## 2018-07-07 DIAGNOSIS — I456 Pre-excitation syndrome: Secondary | ICD-10-CM

## 2018-07-07 MED ORDER — NORETHIN ACE-ETH ESTRAD-FE 1-20 MG-MCG PO TABS: 1.0000 | ORAL_TABLET | Freq: Every day | ORAL | 3 refills | Status: DC

## 2018-07-07 NOTE — Assessment & Plan Note (Addendum)
Periods became irregular when she missed a month Now back on Junel fe doing well- has not been a full month yet  Uses condoms as well  Declines std screen Pap is utd  Wants HPV vaccine when she will get it (no insurance)-she will call back to check on price here and also check with the health dept  OC refilled  Enc safe sexual practices as well

## 2018-07-07 NOTE — Assessment & Plan Note (Signed)
Pt gets tachycardia randomly (can track with her fitness watch)  Per pt - relaxation/ deep breathing will stop it  Anxiety triggers it as well She is interested in est with new cardiologist when she gets insurance (hopes that is soon and she will let us know)

## 2018-07-07 NOTE — Assessment & Plan Note (Signed)
She has occ panic attacks and usually gets them under control with breathing within 20 minutes  Avoiding medication due to WPW at this time Will continue to follow I recommend good self care/outdoor time and exercise when she has time (hard with work and school) Reviewed stressors/ coping techniques/symptoms/ support sources/ tx options and side effects in detail today

## 2018-07-07 NOTE — Progress Notes (Signed)
Virtual Visit via Video Note  I connected with Abigail Pham on 07/07/18 at  3:00 PM EDT by a video enabled telemedicine application and verified that I am speaking with the correct person using two identifiers. The patient is at home today I am in my office at [facility] [place]   we were unable to connect on camera so visit was conducted by phone  I discussed the limitations of evaluation and management by telemedicine and the availability of in person appointments. The patient expressed understanding and agreed to proceed.  History of Present Illness: Presents for f/u of chronic health problems   She is still at her job at [employer]  In school /all on line now - 1 1/2 years left   Has been feeling pretty good  Trying to take care of herself   Current weight - 218 lb - she has gained  Unsure why  Wt Readings from Last 3 Encounters:  10/04/17 210 lb (95.3 kg)  07/09/17 206 lb (93.4 kg)  03/26/17 204 lb 12 oz (92.9 kg)  she is drinking water  Watching her diet  Did some exercise -then lost motivation  Did some calesthenics  (hard to fit in time wise) Still doing house work and yard work    Taking [employer] oral contraceptive  Has not had HPV vaccines Mother had cervical cancer   She does not feel like she needs STD testing  She has BV   No insurance at this time  Missed 3 weeks  Now back on it (since early this month)- period have been very irregular  No bleeding today-just a lot of cramps  She is sexually active -using condoms (they make her itch a bit-but she tolerates it) Would like to have children at age 36  No nausea or breast tenderness   Pap was negative 1/19   H/o anxiety and concentration issues - she feels like she can control it , still has occ panic attack  She went to therapy at behavioral health - no longer going    H/o WPW syndrome - she has a monitor on her phone  3 weeks ago had tachycardia (20 min max) She puts up feet and deep breathes and can  usually stop it  Wants to go to cardiology when she gets insurance    Review of Systems  Constitutional: Negative for chills, diaphoresis, fever, malaise/fatigue and weight loss.  Eyes: Negative for blurred vision.  Respiratory: Negative for cough and wheezing.   Cardiovascular: Positive for palpitations. Negative for chest pain, orthopnea and leg swelling.       Episodic palpitations   Gastrointestinal: Negative for nausea and vomiting.  Genitourinary: Negative for dysuria.       Irregular menses-getting back on OC  Skin: Negative for rash.  Neurological: Negative for dizziness and headaches.  Psychiatric/Behavioral: Negative for depression and suicidal ideas. The patient is nervous/anxious.      Patient Active Problem List   Diagnosis Date Noted  . Oral contraceptive use 07/07/2018  . Anxiety-like symptoms 03/26/2017  . Encounter for annual routine gynecological examination 03/26/2017  . Poor concentration 11/06/2013  . History of ADHD 11/06/2013  . WPW (Wolff-Parkinson-White syndrome) 11/09/2010   Past Medical History:  Diagnosis Date  . Heart murmur of newborn   . History of chicken pox    around age 44  . WPW (Wolff-Parkinson-White syndrome)    Hosp at [facility] at one month of age for Tachyarrrythmia and pulmonary hemorrhage  History reviewed. No pertinent surgical history. Social History   Tobacco Use  . Smoking status: Passive Smoke Exposure - Never Smoker  . Smokeless tobacco: Never Used  . Tobacco comment: pt smokes vapors occ with nicotine 3 mg   Substance Use Topics  . Alcohol use: Yes    Alcohol/week: 0.0 standard drinks    Comment: occ  . Drug use: No   Family History  Problem Relation Age of Onset  . Stroke Paternal Grandfather   . High blood pressure Paternal Grandfather   . High blood pressure Father   . Cervical cancer Mother    Allergies  Allergen Reactions  . Other Other (See Comments)    Antihistamines or other stimulants-heart races   No  current outpatient medications on file prior to visit.   No current facility-administered medications on file prior to visit.     Observations/Objective: The patient sounds like her usual self Not distressed  Nl affect-not seemingly depressed or anxious today   Assessment and Plan: Problem List Items Addressed This Visit      Cardiovascular and Mediastinum   WPW (Wolff-Parkinson-White syndrome)    Pt gets tachycardia randomly (can track with her fitness watch)  Per pt - relaxation/ deep breathing will stop it  Anxiety triggers it as well She is interested in est with new cardiologist when she gets insurance (hopes that is soon and she will let us know)         Other   Anxiety-like symptoms - Primary    She has occ panic attacks and usually gets them under control with breathing within 20 minutes  Avoiding medication due to WPW at this time Will continue to follow I recommend good self care/outdoor time and exercise when she has time (hard with work and school) Reviewed stressors/ coping techniques/symptoms/ support sources/ tx options and side effects in detail today       Oral contraceptive use    Periods became irregular when she missed a month Now back on Junel fe doing well- has not been a full month yet  Uses condoms as well  Declines std screen Pap is utd  Wants HPV vaccine when she will get it (no insurance)-she will call back to check on price here and also check with the health dept  OC refilled  Enc safe sexual practices as well            Follow Up Instructions: Call out office back to get price quote on the HPV vaccine  You can also check with the health dept. To see where you can afford it  I refilled your junel fe for a year  If periods do not regulate back on it let me know  Also keep using condoms and alert us if you want STD screening     I discussed the assessment and treatment plan with the patient. The patient was provided an opportunity to ask  questions and all were answered. The patient agreed with the plan and demonstrated an understanding of the instructions.   The patient was advised to call back or seek an in-person evaluation if the symptoms worsen or if the condition fails to improve as anticipated.     Roxy MannsMarne Hanzel Pizzo, MD

## 2018-09-18 ENCOUNTER — Telehealth: Payer: Self-pay

## 2018-09-18 DIAGNOSIS — I456 Pre-excitation syndrome: Secondary | ICD-10-CM

## 2018-09-18 NOTE — Telephone Encounter (Signed)
Ref done Will route to PCP

## 2018-09-18 NOTE — Telephone Encounter (Signed)
Pt last seen 07/07/18 and WPW was discussed and pt was to cb when has ins for card referral. Pt now has ins and request cardiac referral. Pt has SOB on and off daily but without dizziness. Last wk had episode with lose of breath,dizziness and fast heart beat last wk. ED precautions given and voiced understanding.

## 2018-09-19 ENCOUNTER — Telehealth: Payer: Self-pay

## 2018-09-19 NOTE — Telephone Encounter (Signed)
Appt made and message sent to patient

## 2018-09-19 NOTE — Telephone Encounter (Signed)
Pending

## 2018-09-23 ENCOUNTER — Ambulatory Visit: Payer: Self-pay

## 2018-10-01 ENCOUNTER — Ambulatory Visit (INDEPENDENT_AMBULATORY_CARE_PROVIDER_SITE_OTHER): Payer: BC Managed Care – PPO

## 2018-10-01 DIAGNOSIS — Z23 Encounter for immunization: Secondary | ICD-10-CM | POA: Diagnosis not present

## 2018-10-01 NOTE — Progress Notes (Signed)
Per orders of Dr. Glori Bickers, injection of HPV #1 given by Kris Mouton. Patient tolerated injection well.  Patient was advised to call back to schedule HPV #2 for 2 months after today.

## 2018-10-07 ENCOUNTER — Telehealth (INDEPENDENT_AMBULATORY_CARE_PROVIDER_SITE_OTHER): Payer: BC Managed Care – PPO | Admitting: Cardiovascular Disease

## 2018-10-07 ENCOUNTER — Other Ambulatory Visit: Payer: Self-pay

## 2018-10-07 VITALS — Ht 68.0 in | Wt 212.0 lb

## 2018-10-07 DIAGNOSIS — R002 Palpitations: Secondary | ICD-10-CM | POA: Diagnosis not present

## 2018-10-07 DIAGNOSIS — R079 Chest pain, unspecified: Secondary | ICD-10-CM

## 2018-10-07 DIAGNOSIS — I456 Pre-excitation syndrome: Secondary | ICD-10-CM

## 2018-10-07 DIAGNOSIS — R0602 Shortness of breath: Secondary | ICD-10-CM

## 2018-10-07 NOTE — Addendum Note (Signed)
Addended by: Lamar Laundry on: 10/07/2018 03:00 PM   Modules accepted: Orders

## 2018-10-07 NOTE — Patient Instructions (Signed)
Medication Instructions:  No change in medications If you need a refill on your cardiac medications before your next appointment, please call your pharmacy.   Lab work: None If you have labs (blood work) drawn today and your tests are completely normal, you will receive your results only by: Marland Kitchen MyChart Message (if you have MyChart) OR . A paper copy in the mail If you have any lab test that is abnormal or we need to change your treatment, we will call you to review the results.  Testing/Procedures: We will arrange for 14-day ZIO patch monitor and an echocardiogram  Follow-Up: Follow-up with Dr. Fletcher Anon in 3 months.

## 2018-10-07 NOTE — Progress Notes (Signed)
Virtual Visit via Video Note   This visit type was conducted due to national recommendations for restrictions regarding the COVID-19 Pandemic (e.g. social distancing) in an effort to limit this patient's exposure and mitigate transmission in our community.  Due to her co-morbid illnesses, this patient is at least at moderate risk for complications without adequate follow up.  This format is felt to be most appropriate for this patient at this time.  All issues noted in this document were discussed and addressed.  A limited physical exam was performed with this format.  Please refer to the patient's chart for her consent to telehealth for [facility].   Date:  10/07/2018   ID:  Abigail Pham, DOB Dec 01, 1995, MRN [id number]  Patient Location: Home Provider Location: Office  PCP:  Judy Pimpleower, Marne A, MD  Cardiologist:  No primary care provider on file.  Electrophysiologist:  None   Evaluation Performed:  Consultation - Abigail Pham was referred by Dr. Milinda Antisower for the evaluation of palpitations and WPW.  Chief Complaint: Palpitations and tachycardia  History of Present Illness:    Abigail Pham is a 23 y.o. female with with history of ADHD and anxiety.  She reports history of congenital heart disease with previous hospitalization [facility] when she was 711 month old due to pulmonary hemorrhage and tachycardia thought to be due to Wolff-Parkinson-White syndrome.  She was told that she was treated medically with no ablation performed.  She was on a heart medications that does not remember the name of until she was 23 years old.  She used to follow[facility] but not recently.  I do not have any of her old records. She describes episodes of tachycardia with a heart rate as high as 180 bpm associated with lightheadedness, dizziness and shortness of breath.  She reports having panic attacks frequently and that she does not know if this is related to her heart condition or her  anxiety.  No previous syncope.  She has occasional chest discomfort that is also random. She vapes but does not smoke regular cigarettes.  She quit alcohol drinking and denies any recreational drug use.  She does not know the details of her family history other than arrhythmia affecting her father.  The patient does not have symptoms concerning for COVID-19 infection (fever, chills, cough, or new shortness of breath).    Past Medical History:  Diagnosis Date   Heart murmur of newborn    History of chicken pox    around age 89   WPW (Wolff-Parkinson-White syndrome)    Hosp [facility] at one [facility] of age for Tachyarrrythmia and pulmonary hemorrhage   No past surgical history on file.   Current Meds  Medication Sig   Ascorbic Acid (VITAMIN C PO) Take by mouth daily.   IBUPROFEN PO Take by mouth as needed.   norethindrone-ethinyl estradiol (JUNEL FE 1/20) 1-20 MG-MCG tablet Take 1 tablet by mouth daily.     Allergies:   Other   Social History   Tobacco Use   Smoking status: Passive Smoke Exposure - Never Smoker   Smokeless tobacco: Never Used   Tobacco comment: pt smokes vapors occ with nicotine 3 mg   Substance Use Topics   Alcohol use: Yes    Alcohol/week: 0.0 standard drinks    Comment: occ   Drug use: No     Family Hx: The patient's family history includes Cervical cancer in her mother; High blood pressure  in her father and paternal grandfather; Stroke in her paternal grandfather.  ROS:   Please see the history of present illness.     All other systems reviewed and are negative.   Prior CV studies:   The following studies were reviewed today:    Labs/Other Tests and Data Reviewed:    EKG:  An ECG dated 02/2017 was personally reviewed today and demonstrated:  Sinus tachycardia with no evidence of delta waves.  Recent Labs: No results found for requested labs within last 8760 hours.   Recent Lipid Panel No results found for: CHOL, TRIG, HDL, CHOLHDL,  LDLCALC, LDLDIRECT  Wt Readings from Last 3 Encounters:  10/07/18 212 lb (96.2 kg)  10/04/17 210 lb (95.3 kg)  07/09/17 206 lb (93.4 kg)     Objective:    Vital Signs:  Ht 5\' 8"  (1.727 m)    Wt 212 lb (96.2 kg)    BMI 32.23 kg/m    VITAL SIGNS:  reviewed GEN:  no acute distress EYES:  sclerae anicteric, EOMI - Extraocular Movements Intact RESPIRATORY:  normal respiratory effort, symmetric expansion SKIN:  no rash, lesions or ulcers. MUSCULOSKELETAL:  no obvious deformities. NEURO:  alert and oriented x 3, no obvious focal deficit PSYCH:  normal affect  ASSESSMENT & PLAN:    1. Palpitations and tachycardia with reported history of Wolff-Parkinson-White syndrome: Previous EKGs do not show evidence of preexcitation.  She reports having these episodes frequently enough that we should be able to capture on outpatient monitoring.  I am going to arrange for a 2-week ZIO patch monitor.  2.  Chest pain and shortness of breath: I am going to obtain an echocardiogram to ensure no congenital heart disease.  COVID-19 Education: The signs and symptoms of COVID-19 were discussed with the patient and how to seek care for testing (follow up with PCP or arrange E-visit).  The importance of social distancing was discussed today.  Time:   Today, I have spent 12 minutes with the patient with telehealth technology discussing the above problems.     Medication Adjustments/Labs and Tests Ordered: Current medicines are reviewed at length with the patient today.  Concerns regarding medicines are outlined above.   Tests Ordered: No orders of the defined types were placed in this encounter.   Medication Changes: No orders of the defined types were placed in this encounter.   Follow Up:  In Person in 3 month(s)  Signed, Kathlyn Sacramento, MD  10/07/2018 2:29 PM    [place]

## 2018-10-08 ENCOUNTER — Telehealth: Payer: Self-pay | Admitting: Cardiovascular Disease

## 2018-10-08 NOTE — Telephone Encounter (Signed)
L mom to schedule echo and monitor placement

## 2018-10-14 ENCOUNTER — Ambulatory Visit (INDEPENDENT_AMBULATORY_CARE_PROVIDER_SITE_OTHER): Payer: BC Managed Care – PPO

## 2018-10-14 ENCOUNTER — Other Ambulatory Visit: Payer: Self-pay

## 2018-10-14 DIAGNOSIS — I456 Pre-excitation syndrome: Secondary | ICD-10-CM

## 2018-10-14 DIAGNOSIS — R002 Palpitations: Secondary | ICD-10-CM

## 2018-10-20 ENCOUNTER — Telehealth: Payer: Self-pay | Admitting: Cardiovascular Disease

## 2018-10-20 ENCOUNTER — Inpatient Hospital Stay: Admit: 2018-10-20 | Discharge: 2018-10-20 | Payer: PRIVATE HEALTH INSURANCE

## 2018-10-20 NOTE — ED Notes (Signed)
Patient was seen exiting by Registration staff at front desk.

## 2018-10-20 NOTE — ED Notes (Signed)
Patient was seen exiting by Registration staff at front desk.

## 2018-10-20 NOTE — Telephone Encounter (Signed)
°  1. Is this related to a heart monitor you are wearing?  (If the patient says no, please ask     if they are caling about ICD/pacemaker.) yes  2. What is your issue??  zio adhesive is coming off   Patient advised to also reach out to zio support contact on box  Please route to covering RN/CMA/RMA for results. Route to monitor technicians or your monitor tech representative for your site for any technical concerns

## 2018-10-21 ENCOUNTER — Inpatient Hospital Stay
Admit: 2018-10-21 | Discharge: 2018-10-21 | Disposition: A | Discharge status: Home / Self Care | Payer: MEDICAID | Attending: Emergency Medicine

## 2018-10-21 ENCOUNTER — Emergency Department: Admit: 2018-10-21 | Payer: MEDICAID

## 2018-10-21 DIAGNOSIS — R509 Fever, unspecified: Secondary | ICD-10-CM

## 2018-10-21 LAB — POC URINE MICROSCOPIC
EPITHELIAL CELLS, SQUAMOUS, EPITHSQ: >50 /LPF
Epithelial cells, squamous: >50 /LPF

## 2018-10-21 LAB — METABOLIC PANEL, BASIC
Anion gap: 3 mmol/L — ABNORMAL LOW (ref 5–15)
BUN: 7 mg/dl (ref 7–25)
CO2: 27 mEq/L (ref 21–32)
Calcium: 9 mg/dl (ref 8.5–10.1)
Chloride: 106 mEq/L (ref 98–107)
Creatinine: 0.8 mg/dl (ref 0.6–1.3)
GFR est AA: >60
GFR est non-AA: >60
Glucose: 97 mg/dl (ref 74–106)
Potassium: 3.8 mEq/L (ref 3.5–5.1)
Sodium: 137 mEq/L (ref 136–145)

## 2018-10-21 LAB — CBC WITH AUTOMATED DIFF
BASOPHILS: 0.2 % (ref 0–3)
EOSINOPHILS: 0 % (ref 0–5)
HCT: 33.8 % — ABNORMAL LOW (ref 37.0–50.0)
HGB: 9.6 gm/dl — ABNORMAL LOW (ref 13.0–17.2)
IMMATURE GRANULOCYTES: 0.2 % (ref 0.0–3.0)
LYMPHOCYTES: 25.2 % — ABNORMAL LOW (ref 28–48)
MCH: 19.3 pg — ABNORMAL LOW (ref 25.4–34.6)
MCHC: 28.4 gm/dl — ABNORMAL LOW (ref 30.0–36.0)
MCV: 68 fL — ABNORMAL LOW (ref 80.0–98.0)
MONOCYTES: 5.3 % (ref 1–13)
MPV: 10.8 fL — ABNORMAL HIGH (ref 6.0–10.0)
NEUTROPHILS: 69.1 % — ABNORMAL HIGH (ref 34–64)
NRBC: 0 (ref 0–0)
PLATELET: 301 10*3/uL (ref 140–450)
RBC: 4.97 M/uL (ref 3.60–5.20)
RDW-SD: 42.5 (ref 36.4–46.3)
WBC: 4.5 10*3/uL (ref 4.0–11.0)

## 2018-10-21 LAB — URINALYSIS W/ RFLX MICROSCOPIC
Bilirubin, Urine: NEGATIVE
Bilirubin: NEGATIVE
Blood, Urine: NEGATIVE
Blood: NEGATIVE
Glucose, Ur: NEGATIVE mg/dl
Glucose: NEGATIVE mg/dl
Ketone: NEGATIVE mg/dl
Ketones, Urine: NEGATIVE mg/dl
Leukocyte Esterase, Urine: NEGATIVE
Leukocyte Esterase: NEGATIVE
Nitrite, Urine: NEGATIVE
Nitrites: NEGATIVE
Specific Gravity, UA: 1.02 (ref 1.005–1.030)
Specific gravity: 1.02 (ref 1.005–1.030)
Urobilinogen, UA, POCT: 2 mg/dl — ABNORMAL HIGH (ref 0.0–1.0)
Urobilinogen: 2 mg/dl — ABNORMAL HIGH (ref 0.0–1.0)
pH (UA): 7 (ref 5.0–9.0)
pH, UA: 7 (ref 5.0–9.0)

## 2018-10-21 LAB — HCG URINE, QL
HCG urine, QL: NEGATIVE
Pregnancy Test(Urn): NEGATIVE

## 2018-10-21 LAB — BASIC METABOLIC PANEL
Anion Gap: 3 mmol/L — ABNORMAL LOW (ref 5–15)
BUN: 7 mg/dl (ref 7–25)
CO2: 27 mEq/L (ref 21–32)
Calcium: 9 mg/dl (ref 8.5–10.1)
Chloride: 106 mEq/L (ref 98–107)
Creatinine: 0.8 mg/dl (ref 0.6–1.3)
EGFR IF NonAfrican American: >60
GFR African American: >60
Glucose: 97 mg/dl (ref 74–106)
Potassium: 3.8 mEq/L (ref 3.5–5.1)
Sodium: 137 mEq/L (ref 136–145)

## 2018-10-21 LAB — CBC WITH AUTO DIFFERENTIAL
Basophils %: 0.2 % (ref 0–3)
Eosinophils %: 0 % (ref 0–5)
Hematocrit: 33.8 % — ABNORMAL LOW (ref 37.0–50.0)
Hemoglobin: 9.6 gm/dl — ABNORMAL LOW (ref 13.0–17.2)
Immature Granulocytes: 0.2 % (ref 0.0–3.0)
Lymphocytes %: 25.2 % — ABNORMAL LOW (ref 28–48)
MCH: 19.3 pg — ABNORMAL LOW (ref 25.4–34.6)
MCHC: 28.4 gm/dl — ABNORMAL LOW (ref 30.0–36.0)
MCV: 68 fL — ABNORMAL LOW (ref 80.0–98.0)
MPV: 10.8 fL — ABNORMAL HIGH (ref 6.0–10.0)
Monocytes %: 5.3 % (ref 1–13)
Neutrophils %: 69.1 % — ABNORMAL HIGH (ref 34–64)
Nucleated RBCs: 0 (ref 0–0)
Platelets: 301 10*3/uL (ref 140–450)
RBC: 4.97 M/uL (ref 3.60–5.20)
RDW-SD: 42.5 (ref 36.4–46.3)
WBC: 4.5 10*3/uL (ref 4.0–11.0)

## 2018-10-21 MED ORDER — KETOROLAC TROMETHAMINE 15 MG/ML INJECTION
15 mg/mL | INTRAMUSCULAR | Status: AC
Start: 2018-10-21 — End: 2018-10-21
  Administered 2018-10-21: 16:00:00 via INTRAMUSCULAR

## 2018-10-21 MED ORDER — ACETAMINOPHEN 325 MG TABLET
325 mg | ORAL | Status: AC
Start: 2018-10-21 — End: 2018-10-21
  Administered 2018-10-21: 16:00:00 via ORAL

## 2018-10-21 MED FILL — TYLENOL 325 MG TABLET: 325 mg | ORAL | Qty: 3

## 2018-10-21 MED FILL — KETOROLAC TROMETHAMINE 15 MG/ML INJECTION: 15 mg/mL | INTRAMUSCULAR | Qty: 1

## 2018-10-21 NOTE — Progress Notes (Signed)
PVL LLEV PRELIM:    1. No evidence of DVT within the left lower extremity veins assessed.  2. Patent contralateral common femoral vein with no evidence of DVT.  3. Normal venous signals throughout.    Final report to follow.    Heather Shepherd, RVT

## 2018-10-21 NOTE — ED Triage Notes (Signed)
Patient complains of left leg swelling/pain that started a month ago, past 2 days she has been dizzy and state the pain is gone in her leg, denies any history of DVT.

## 2018-10-21 NOTE — ED Provider Notes (Signed)
[facility]  Emergency Department Treatment Report    Patient: Casey Schwartz Age: 23 y.o. Sex: female    Date of Birth: [birth year] Admit Date: 10/21/2018 PCP: None   MRN: [id number]  CSN: [id number]     Room: OTF/OTF Time Dictated: 12:03 PM      Dragon medical dictation software was used for portions of this report.  Unintended transcription errors may occur.    Chief Complaint   "I do not feel good"  History of Present Illness   23 y.o. female presents today for evaluation.  She states that she has had pain in her left leg now for 6 months which was fairly mild, had a focal swelling area to the lower shin.  Over the last month that has gotten a bit worse, swelling is gotten a bit larger.  In the last couple of days she has had some body aches, the pain in the left leg seems to be a bit worse.  She also feels a bit dizzy, feels a bit lightheaded.  She has had some chills but does not think that she has had a fever.  She has no cough, congestion, no sore throat, she has a mild headache, but no associated neck pain.  She has no abdominal pain, nausea or vomiting, no loose stools or diarrhea, she notes no change in her sense of smell or taste.    Review of Systems   Review of Systems   Constitutional: Positive for chills. Negative for diaphoresis, fever and weight loss.   HENT: Negative for congestion and sore throat.    Respiratory: Negative for cough, sputum production, shortness of breath and wheezing.    Cardiovascular: Negative for chest pain, palpitations, orthopnea and leg swelling.   Gastrointestinal: Negative for abdominal pain, diarrhea, nausea and vomiting.   Genitourinary: Negative for dysuria, frequency and urgency.   Musculoskeletal: Positive for myalgias. Negative for back pain and falls.   Skin: Negative for rash.   Neurological: Positive for headaches. Negative for dizziness.   Endo/Heme/Allergies: Does not bruise/bleed easily.   Psychiatric/Behavioral: Negative for substance abuse.        Past Medical/Surgical History   History reviewed. No pertinent past medical history.  History reviewed. No pertinent surgical history.    Social History     Social History     Socioeconomic History   ??? Marital status: SINGLE     Spouse name: Not on file   ??? Number of children: Not on file   ??? Years of education: Not on file   ??? Highest education level: Not on file   Tobacco Use   ??? Smoking status: Never Smoker   ??? Smokeless tobacco: Never Used   Substance and Sexual Activity   ??? Alcohol use: No   ??? Drug use: No       Family History     Family History   Family history unknown: Yes       Current Medications     Current Outpatient Medications   Medication Sig Dispense Refill   ??? ibuprofen (MOTRIN) 800 mg tablet Take 1 Tab by mouth every eight (8) hours as needed for Pain (and inflammation.). 30 Tab 0       Allergies   No Known Allergies    Physical Exam     Visit Vitals  BP 126/76   Pulse (!) 116   Temp 99.3 ??F (37.4 ??C)   Resp 16   Ht 5\' 3"  (1.6 m)   Wt  113.4 kg (250 lb)   SpO2 100%   BMI 44.29 kg/m??     Physical Exam  Constitutional:       General: She is not in acute distress.     Appearance: She is not diaphoretic.   HENT:      Head: Normocephalic and atraumatic.      Mouth/Throat:      Mouth: Mucous membranes are moist.   Eyes:      General:         Right eye: No discharge.         Left eye: No discharge.      Conjunctiva/sclera: Conjunctivae normal.      Pupils: Pupils are equal, round, and reactive to light.   Neck:      Musculoskeletal: Normal range of motion and neck supple. No neck rigidity or muscular tenderness.      Vascular: No carotid bruit.      Trachea: No tracheal deviation.   Cardiovascular:      Rate and Rhythm: Normal rate.      Heart sounds: Normal heart sounds. No murmur. No friction rub. No gallop.    Pulmonary:      Effort: Pulmonary effort is normal. No respiratory distress.      Breath sounds: Normal breath sounds. No stridor. No wheezing or rales.   Abdominal:       General: Bowel sounds are normal. There is no distension.      Palpations: Abdomen is soft. There is no mass.      Tenderness: There is no abdominal tenderness. There is no guarding or rebound.   Musculoskeletal: Normal range of motion.         General: No deformity.      Comments: Left lower extremity with a very small focal area of swelling which appears to be soft tissue in nature, it is nontender, it is not erythematous, there is no fluctuance, there is no abscess, no evidence of cellulitis.  The calf itself is soft and nontender.  Neurovascularly the leg/foot is intact.   Lymphadenopathy:      Cervical: No cervical adenopathy.   Skin:     General: Skin is warm and dry.      Coloration: Skin is not pale.      Findings: No erythema or rash.   Neurological:      Mental Status: She is alert.   Psychiatric:         Mood and Affect: Affect normal.         Impression and Management Plan   23 year old female here today with fever, headache, feeling vaguely dizzy with left leg pain.  The left leg is actually fairly unremarkable, my suspicion for deep vein thrombus is relatively low, the focal area of swelling is been present for month, it may be a lipoma, the idea of malignancy is certainly on the differential.  Given the current pandemic and the fever which she was unaware that she has certainly coronavirus would be on the differential as well.  Will do PVL, planum of the tib-fib as the etiology of this is unclear, she has no chest pain, no shortness of breath, no pulmonary complaints, do not feel that she warrants chest x-ray, regarding her headache, it is mild, it is posterior, she has no meningismus, I do not suspect central infection/meningitis.    Diagnostic Studies   Lab:   Recent Results (from the past 12 hour(s))   CBC WITH AUTOMATED DIFF  Collection Time: 10/21/18 10:20 AM   Result Value Ref Range    WBC 4.5 4.0 - 11.0 1000/mm3    RBC 4.97 3.60 - 5.20 M/uL    HGB 9.6 (L) 13.0 - 17.2 gm/dl     HCT 40.933.8 (L) 81.137.0 - 50.0 %    MCV 68.0 (L) 80.0 - 98.0 fL    MCH 19.3 (L) 25.4 - 34.6 pg    MCHC 28.4 (L) 30.0 - 36.0 gm/dl    PLATELET 914301 782140 - 956450 1000/mm3    MPV 10.8 (H) 6.0 - 10.0 fL    RDW-SD 42.5 36.4 - 46.3      NRBC 0 0 - 0      IMMATURE GRANULOCYTES 0.2 0.0 - 3.0 %    NEUTROPHILS 69.1 (H) 34 - 64 %    LYMPHOCYTES 25.2 (L) 28 - 48 %    MONOCYTES 5.3 1 - 13 %    EOSINOPHILS 0.0 0 - 5 %    BASOPHILS 0.2 0 - 3 %   METABOLIC PANEL, BASIC    Collection Time: 10/21/18 10:20 AM   Result Value Ref Range    Sodium 137 136 - 145 mEq/L    Potassium 3.8 3.5 - 5.1 mEq/L    Chloride 106 98 - 107 mEq/L    CO2 27 21 - 32 mEq/L    Glucose 97 74 - 106 mg/dl    BUN 7 7 - 25 mg/dl    Creatinine 0.8 0.6 - 1.3 mg/dl    GFR est AA >21.3>60.0      GFR est non-AA >60      Calcium 9.0 8.5 - 10.1 mg/dl    Anion gap 3 (L) 5 - 15 mmol/L   URINALYSIS W/ RFLX MICROSCOPIC    Collection Time: 10/21/18 12:25 PM   Result Value Ref Range    Glucose NEGATIVE NEGATIVE,Negative mg/dl    Bilirubin NEGATIVE NEGATIVE,Negative      Ketone NEGATIVE NEGATIVE,Negative mg/dl    Specific gravity 0.8651.020 1.005 - 1.030      Blood NEGATIVE NEGATIVE,Negative      pH (UA) 7.0 5.0 - 9.0      Protein TRACE (A) NEGATIVE,Negative mg/dl    Urobilinogen 2.0 (H) 0.0 - 1.0 mg/dl    Nitrites NEGATIVE NEGATIVE,Negative      Leukocyte Esterase NEGATIVE NEGATIVE,Negative     HCG URINE, QL    Collection Time: 10/21/18 12:25 PM   Result Value Ref Range    HCG urine, QL NEGATIVE NEGATIVE         Imaging:    Xr Tib/fib Lt    Result Date: 10/21/2018  Examination:  2 views of the left tib-fib INDICATION: progressiva pain, swelling, unclear etiology COMPARISON: None     IMPRESSION: No evidence of an acute fracture or dislocation. No acute abnormalities.     PVL LLEV PRELIM:  ??  1. No evidence of DVT within the left lower extremity veins assessed.  2. Patent contralateral common femoral vein with no evidence of DVT.  3. Normal venous signals throughout.  ??  Final report to follow.  ??   Carolynn ServeHeather Shepherd, RVT        Medical Decision Making/ED Course   Patient steadily felt better, she defervesced.  Heart rate somewhat elevated when she is moving about, when she rests it comes down into the low 90s/high 80s.  Given her otherwise benign examination, will discharge to home with advised primary care follow-up, conservative management with oral hydration, ibuprofen and  Tylenol as needed, regards to the left lower extremity swelling, this is likely either a focal area of lipoma, though she advised that underlying mass cannot be totally excluded, she is strongly advised to follow-up with her primary care physician, we have provided one that she may follow-up with.           Final Diagnosis       ICD-10-CM ICD-9-CM   1. Fever, unspecified fever cause  R50.9 780.60   2. Pain of left lower extremity  M79.605 729.5     Disposition   Discharge to home    Wynelle BourgeoisJonathan A Antonia Culbertson, MD  October 21, 2018    My signature above authenticates this document and my orders, the final    diagnosis (es), discharge prescription (s), and instructions in the Epic    record.  If you have any questions please contact [phone].

## 2018-10-21 NOTE — ED Notes (Signed)
1:22 PM  10/21/18     Discharge instructions given to Casey Schwartz (name) with verbalization of understanding. Patient accompanied by mom.  Patient discharged with the following prescriptions none. Patient discharged to home (destination).      Stephanie Parker, RN

## 2018-10-21 NOTE — ED Notes (Signed)
1:22 PM  10/21/18     Discharge instructions given to Casey Schwartz (name) with verbalization of understanding. Patient accompanied by mom.  Patient discharged with the following prescriptions none. Patient discharged to home (destination).      Addison Bailey, RN

## 2018-10-21 NOTE — ED Provider Notes (Signed)
[facility]  Emergency Department Treatment Report    Patient: Casey Schwartz Age: 23 y.o. Sex: female    Date of Birth: [birth year] Admit Date: 10/21/2018 PCP: None   MRN: [id number]  CSN: [id number]     Room: OTF/OTF Time Dictated: 12:03 PM      Dragon medical dictation software was used for portions of this report.  Unintended transcription errors may occur.    Chief Complaint   "I do not feel good"  History of Present Illness   23 y.o. female presents today for evaluation.  She states that she has had pain in her left leg now for 6 months which was fairly mild, had a focal swelling area to the lower shin.  Over the last month that has gotten a bit worse, swelling is gotten a bit larger.  In the last couple of days she has had some body aches, the pain in the left leg seems to be a bit worse.  She also feels a bit dizzy, feels a bit lightheaded.  She has had some chills but does not think that she has had a fever.  She has no cough, congestion, no sore throat, she has a mild headache, but no associated neck pain.  She has no abdominal pain, nausea or vomiting, no loose stools or diarrhea, she notes no change in her sense of smell or taste.    Review of Systems   Review of Systems   Constitutional: Positive for chills. Negative for diaphoresis, fever and weight loss.   HENT: Negative for congestion and sore throat.    Respiratory: Negative for cough, sputum production, shortness of breath and wheezing.    Cardiovascular: Negative for chest pain, palpitations, orthopnea and leg swelling.   Gastrointestinal: Negative for abdominal pain, diarrhea, nausea and vomiting.   Genitourinary: Negative for dysuria, frequency and urgency.   Musculoskeletal: Positive for myalgias. Negative for back pain and falls.   Skin: Negative for rash.   Neurological: Positive for headaches. Negative for dizziness.   Endo/Heme/Allergies: Does not bruise/bleed easily.   Psychiatric/Behavioral: Negative for substance abuse.        Past Medical/Surgical History   History reviewed. No pertinent past medical history.  History reviewed. No pertinent surgical history.    Social History     Social History     Socioeconomic History   ??? Marital status: SINGLE     Spouse name: Not on file   ??? Number of children: Not on file   ??? Years of education: Not on file   ??? Highest education level: Not on file   Tobacco Use   ??? Smoking status: Never Smoker   ??? Smokeless tobacco: Never Used   Substance and Sexual Activity   ??? Alcohol use: No   ??? Drug use: No       Family History     Family History   Family history unknown: Yes       Current Medications     Current Outpatient Medications   Medication Sig Dispense Refill   ??? ibuprofen (MOTRIN) 800 mg tablet Take 1 Tab by mouth every eight (8) hours as needed for Pain (and inflammation.). 30 Tab 0       Allergies   No Known Allergies    Physical Exam     Visit Vitals  BP 126/76   Pulse (!) 116   Temp 99.3 ??F (37.4 ??C)   Resp 16   Ht 5\' 3"  (1.6 m)   Wt  113.4 kg (250 lb)   SpO2 100%   BMI 44.29 kg/m??     Physical Exam  Constitutional:       General: She is not in acute distress.     Appearance: She is not diaphoretic.   HENT:      Head: Normocephalic and atraumatic.      Mouth/Throat:      Mouth: Mucous membranes are moist.   Eyes:      General:         Right eye: No discharge.         Left eye: No discharge.      Conjunctiva/sclera: Conjunctivae normal.      Pupils: Pupils are equal, round, and reactive to light.   Neck:      Musculoskeletal: Normal range of motion and neck supple. No neck rigidity or muscular tenderness.      Vascular: No carotid bruit.      Trachea: No tracheal deviation.   Cardiovascular:      Rate and Rhythm: Normal rate.      Heart sounds: Normal heart sounds. No murmur. No friction rub. No gallop.    Pulmonary:      Effort: Pulmonary effort is normal. No respiratory distress.      Breath sounds: Normal breath sounds. No stridor. No wheezing or rales.   Abdominal:      General: Bowel  sounds are normal. There is no distension.      Palpations: Abdomen is soft. There is no mass.      Tenderness: There is no abdominal tenderness. There is no guarding or rebound.   Musculoskeletal: Normal range of motion.         General: No deformity.      Comments: Left lower extremity with a very small focal area of swelling which appears to be soft tissue in nature, it is nontender, it is not erythematous, there is no fluctuance, there is no abscess, no evidence of cellulitis.  The calf itself is soft and nontender.  Neurovascularly the leg/foot is intact.   Lymphadenopathy:      Cervical: No cervical adenopathy.   Skin:     General: Skin is warm and dry.      Coloration: Skin is not pale.      Findings: No erythema or rash.   Neurological:      Mental Status: She is alert.   Psychiatric:         Mood and Affect: Affect normal.         Impression and Management Plan   23 year old female here today with fever, headache, feeling vaguely dizzy with left leg pain.  The left leg is actually fairly unremarkable, my suspicion for deep vein thrombus is relatively low, the focal area of swelling is been present for month, it may be a lipoma, the idea of malignancy is certainly on the differential.  Given the current pandemic and the fever which she was unaware that she has certainly coronavirus would be on the differential as well.  Will do PVL, planum of the tib-fib as the etiology of this is unclear, she has no chest pain, no shortness of breath, no pulmonary complaints, do not feel that she warrants chest x-ray, regarding her headache, it is mild, it is posterior, she has no meningismus, I do not suspect central infection/meningitis.    Diagnostic Studies   Lab:   Recent Results (from the past 12 hour(s))   CBC WITH AUTOMATED DIFF  Collection Time: 10/21/18 10:20 AM   Result Value Ref Range    WBC 4.5 4.0 - 11.0 1000/mm3    RBC 4.97 3.60 - 5.20 M/uL    HGB 9.6 (L) 13.0 - 17.2 gm/dl    HCT 33.8 (L) 37.0 - 50.0 %     MCV 68.0 (L) 80.0 - 98.0 fL    MCH 19.3 (L) 25.4 - 34.6 pg    MCHC 28.4 (L) 30.0 - 36.0 gm/dl    PLATELET 301 140 - 450 1000/mm3    MPV 10.8 (H) 6.0 - 10.0 fL    RDW-SD 42.5 36.4 - 46.3      NRBC 0 0 - 0      IMMATURE GRANULOCYTES 0.2 0.0 - 3.0 %    NEUTROPHILS 69.1 (H) 34 - 64 %    LYMPHOCYTES 25.2 (L) 28 - 48 %    MONOCYTES 5.3 1 - 13 %    EOSINOPHILS 0.0 0 - 5 %    BASOPHILS 0.2 0 - 3 %   METABOLIC PANEL, BASIC    Collection Time: 10/21/18 10:20 AM   Result Value Ref Range    Sodium 137 136 - 145 mEq/L    Potassium 3.8 3.5 - 5.1 mEq/L    Chloride 106 98 - 107 mEq/L    CO2 27 21 - 32 mEq/L    Glucose 97 74 - 106 mg/dl    BUN 7 7 - 25 mg/dl    Creatinine 0.8 0.6 - 1.3 mg/dl    GFR est AA >60.0      GFR est non-AA >60      Calcium 9.0 8.5 - 10.1 mg/dl    Anion gap 3 (L) 5 - 15 mmol/L   URINALYSIS W/ RFLX MICROSCOPIC    Collection Time: 10/21/18 12:25 PM   Result Value Ref Range    Glucose NEGATIVE NEGATIVE,Negative mg/dl    Bilirubin NEGATIVE NEGATIVE,Negative      Ketone NEGATIVE NEGATIVE,Negative mg/dl    Specific gravity 1.020 1.005 - 1.030      Blood NEGATIVE NEGATIVE,Negative      pH (UA) 7.0 5.0 - 9.0      Protein TRACE (A) NEGATIVE,Negative mg/dl    Urobilinogen 2.0 (H) 0.0 - 1.0 mg/dl    Nitrites NEGATIVE NEGATIVE,Negative      Leukocyte Esterase NEGATIVE NEGATIVE,Negative     HCG URINE, QL    Collection Time: 10/21/18 12:25 PM   Result Value Ref Range    HCG urine, QL NEGATIVE NEGATIVE         Imaging:    Xr Tib/fib Lt    Result Date: 10/21/2018  Examination:  2 views of the left tib-fib INDICATION: progressiva pain, swelling, unclear etiology COMPARISON: None     IMPRESSION: No evidence of an acute fracture or dislocation. No acute abnormalities.     PVL LLEV PRELIM:  ??  1. No evidence of DVT within the left lower extremity veins assessed.  2. Patent contralateral common femoral vein with no evidence of DVT.  3. Normal venous signals throughout.  ??  Final report to follow.  ??  Waldon Reining,  RVT        Medical Decision Making/ED Course   Patient steadily felt better, she defervesced.  Heart rate somewhat elevated when she is moving about, when she rests it comes down into the low 90s/high 80s.  Given her otherwise benign examination, will discharge to home with advised primary care follow-up, conservative management with oral hydration, ibuprofen and  Tylenol as needed, regards to the left lower extremity swelling, this is likely either a focal area of lipoma, though she advised that underlying mass cannot be totally excluded, she is strongly advised to follow-up with her primary care physician, we have provided one that she may follow-up with.           Final Diagnosis       ICD-10-CM ICD-9-CM   1. Fever, unspecified fever cause  R50.9 780.60   2. Pain of left lower extremity  M79.605 729.5     Disposition   Discharge to home    Wynelle BourgeoisJonathan A Antonia Culbertson, MD  October 21, 2018    My signature above authenticates this document and my orders, the final    diagnosis (es), discharge prescription (s), and instructions in the Epic    record.  If you have any questions please contact [phone].

## 2018-10-21 NOTE — ED Notes (Signed)
Patient complains of left leg swelling/pain that started a month ago, past 2 days she has been dizzy and state the pain is gone in her leg, denies any history of DVT.

## 2018-10-21 NOTE — Telephone Encounter (Signed)
Returned call to patient. She contacted zio yesterday and took their advice for getting it adhered to skin. She reported that so far it is working.   Advised pt to call for any further questions or concerns.

## 2018-11-10 DIAGNOSIS — R002 Palpitations: Secondary | ICD-10-CM | POA: Diagnosis not present

## 2018-11-11 ENCOUNTER — Other Ambulatory Visit: Payer: Self-pay

## 2018-11-12 ENCOUNTER — Telehealth: Payer: Self-pay

## 2018-11-12 MED ORDER — METOPROLOL SUCCINATE ER 25 MG PO TB24: 25.0000 mg | ORAL_TABLET | Freq: Every day | ORAL | 5 refills | Status: DC

## 2018-11-12 NOTE — Telephone Encounter (Signed)
-----   Message from Wellington Hampshire, MD sent at 11/12/2018 11:17 AM EDT ----- Inform patient that monitor showed some PVCs and intermittent sinus tachycardia which is likely causing some of her palpitations.  Recommend adding Toprol 25 mg once daily.  Schedule follow-up visit with me after her echocardiogram is done.

## 2018-11-12 NOTE — Telephone Encounter (Signed)
Patient made aware of monitor results and Dr.Arida's recommendation. Patient is agreeable with starting metoprolol succ 25mg  daily. Rx sent to the patient's pharmacy. Follow up appt with Dr. Fletcher Anon scheduled for 12/09/18 @ 4:40pm. Patient verbalized understanding to the instruction given and voiced appreciation for the call.

## 2018-12-02 ENCOUNTER — Ambulatory Visit: Payer: BC Managed Care – PPO

## 2018-12-02 ENCOUNTER — Other Ambulatory Visit: Payer: Self-pay

## 2018-12-03 DIAGNOSIS — N3000 Acute cystitis without hematuria: Secondary | ICD-10-CM | POA: Diagnosis not present

## 2018-12-05 ENCOUNTER — Other Ambulatory Visit: Payer: Self-pay

## 2018-12-05 ENCOUNTER — Ambulatory Visit (INDEPENDENT_AMBULATORY_CARE_PROVIDER_SITE_OTHER): Payer: BC Managed Care – PPO

## 2018-12-05 DIAGNOSIS — R0602 Shortness of breath: Secondary | ICD-10-CM

## 2018-12-05 DIAGNOSIS — R079 Chest pain, unspecified: Secondary | ICD-10-CM | POA: Diagnosis not present

## 2018-12-08 ENCOUNTER — Telehealth: Payer: Self-pay | Admitting: Family Medicine

## 2018-12-08 NOTE — Telephone Encounter (Signed)
Patient stated she believe she is due for another visit for her HPV .  Can you verify this so we can get her scheduled?  And when is the best time to schedule her?

## 2018-12-08 NOTE — Telephone Encounter (Signed)
1st HPV was 10/01/18 so she is due for her 2nd HPV now (anytime) and 3rd HPV needs to be at least 6 months from 10/01/18, please schedule both on nurse visit schedule where they have an opening

## 2018-12-09 ENCOUNTER — Ambulatory Visit: Payer: Self-pay | Admitting: Cardiovascular Disease

## 2018-12-12 NOTE — Progress Notes (Signed)
Cardiology Office Note    Date:  12/15/2018   ID:  Abigail, Pham 1995/11/10, MRN [id number]  PCP:  Abigail Greenspan, MD  Cardiologist:  Abigail Sacramento, MD  Electrophysiologist:  None   Chief Complaint: Follow up  History of Present Illness:   Abigail Pham is a 23 y.o. 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  Patient was initially evaluated virtually by Dr. Fletcher Anon in late 09/2018 and reported a prior hospitalization at [facility] when she was 60 month old due to pulmonary hemorrhage and tachycardia felt to be secondary to Wolff-Parkinson-White syndrome.  She reported she was treated medically with no ablation performed.  She also stated she was on heart medication so did not know what they were until she was 23 years old.  She reported previously following up with her pediatric cardiologist at [facility] though not recently.  Details of all of the above were unclear as we did not have records.  At her tele-visit with [place] in 09/2018, she reported episodes of tachypalpitations with heart rate as high as 180 bpm with associated lightheadedness, dizziness, and shortness of breath.  She reported frequent panic attacks and was uncertain if these were playing a role or not.  She also noted occasional randomly occurring chest discomfort.  She reported ongoing vape usage.  She indicated she had quit drinking alcohol and denies any recreational drug abuse.  She was unaware of her family history other than an arrhythmia affecting her father.  Review of prior EKGs did not show any evidence of preexcitation.  Given the reported frequency of her palpitations it was felt we should be able to capture these on a two-week ZIO patch which showed a predominant rhythm of sinus with an average heart rate of 92 bpm with intermittent delta wave noted consistent with preexcitation, rare PACs and PVCs, no evidence of  SVT.  Patient triggered events correlated with sinus tachycardia and PVCs.  It was recommended she start Toprol-XL 25 mg once daily.  Subsequent echo on 12/05/2018 showed an EF of 50 to 55%, no LVH, normal RV systolic function, mild to moderate mitral regurgitation.  Since being placed on metoprolol following the above outpatient cardiac monitoring she has noted significant improvement in her palpitations, dizziness, shortness of breath, and chest pain.  She denies any presyncope or syncopal episodes.  No lower extremity swelling, orthopnea, PND, or early satiety.  No further chest pain.  She continues to note intermittent shortness of breath though improved and attributes this to underlying obesity, physical deconditioning, and anxiety.    Past Medical History:  Diagnosis Date  . Heart murmur of newborn   . History of chicken pox    around age 70  . WPW (Wolff-Parkinson-White syndrome)    Hosp at [facility] at one month of age for [place] and pulmonary hemorrhage    History reviewed. No pertinent surgical history.  Current Medications: Current Meds  Medication Sig  . Ascorbic Acid (VITAMIN C PO) Take by mouth daily.  . IBUPROFEN PO Take by mouth as needed.  . metoprolol succinate (TOPROL XL) 25 MG 24 hr tablet Take 1 tablet (25 mg total) by mouth daily.  . norethindrone-ethinyl estradiol (JUNEL FE 1/20) 1-20 MG-MCG tablet Take 1 tablet by mouth daily.    Allergies:   Other   Social History   Socioeconomic History  . Marital status: Single    Spouse  name: Not on file  . Number of children: Not on file  . Years of education: Not on file  . Highest education level: Not on file  Occupational History  . Not on file  Social Needs  . Financial resource strain: Not on file  . Food insecurity    Worry: Not on file    Inability: Not on file  . Transportation needs    Medical: Not on file    Non-medical: Not on file  Tobacco Use  . Smoking status: Passive Smoke Exposure - Never  Smoker  . Smokeless tobacco: Never Used  . Tobacco comment: pt smokes vapors occ with nicotine 3 mg   Substance and Sexual Activity  . Alcohol use: Yes    Alcohol/week: 0.0 standard drinks    Comment: occ  . Drug use: No  . Sexual activity: Yes    Birth control/protection: Pill  Lifestyle  . Physical activity    Days per week: Not on file    Minutes per session: Not on file  . Stress: Not on file  Relationships  . Social Musicianconnections    Talks on phone: Not on file    Gets together: Not on file    Attends religious service: Not on file    Active member of club or organization: Not on file    Attends meetings of clubs or organizations: Not on file    Relationship status: Not on file  Other Topics Concern  . Not on file  Social History Narrative  . Not on file     Family History:  The patient's family history includes Cervical cancer in her mother; High blood pressure in her father and paternal grandfather; Stroke in her paternal grandfather.  ROS:   Review of Systems  Constitutional: Negative for chills, diaphoresis, fever, malaise/fatigue and weight loss.  HENT: Negative for congestion.   Eyes: Negative for discharge and redness.  Respiratory: Positive for shortness of breath. Negative for cough, hemoptysis, sputum production and wheezing.   Cardiovascular: Negative for chest pain, palpitations, orthopnea, claudication, leg swelling and PND.  Gastrointestinal: Negative for abdominal pain, blood in stool, heartburn, melena, nausea and vomiting.  Genitourinary: Negative for hematuria.  Musculoskeletal: Negative for falls and myalgias.  Skin: Negative for rash.  Neurological: Negative for dizziness, tingling, tremors, sensory change, speech change, focal weakness, loss of consciousness and weakness.  Endo/Heme/Allergies: Does not bruise/bleed easily.  Psychiatric/Behavioral: Negative for substance abuse. The patient is nervous/anxious.   All other systems reviewed and are  negative.    EKGs/Labs/Other Studies Reviewed:    Studies reviewed were summarized above. The additional studies were reviewed today:  Zio 11/2018: 14-day Zio patch monitor:  Normal sinus rhythm with an average heart rate of 92 bpm. Intermittent delta wave noted consistent with preexcitation. Rare PACs and rare PVCs. No evidence of SVT. Triggered events by the patient correlated with sinus tachycardia and PVCs __________  2D Echo 11/2018: 1. Left ventricular ejection fraction, by visual estimation, is 50 to 55%. The left ventricle has normal function. Normal left ventricular size. There is no left ventricular hypertrophy.  2. Global right ventricle has normal systolic function.The right ventricular size is normal. No increase in right ventricular wall thickness.  3. Left atrial size was normal.  4. The mitral valve is normal in structure. Mild to moderate mitral valve regurgitation.  5. TR signal is inadequate for assessing pulmonary artery systolic pressure.   EKG:  EKG is ordered today.  The EKG ordered today demonstrates  NSR, 75 bpm, normal axis, normal PR interval at 154 ms, no obvious evidence of preexcitation, baseline wandering, no acute ST-T changes  Recent Labs: No results found for requested labs within last 8760 hours.  Recent Lipid Panel No results found for: CHOL, TRIG, HDL, CHOLHDL, VLDL, LDLCALC, LDLDIRECT  PHYSICAL EXAM:    VS:  BP 120/66 (BP Location: Left Arm, Patient Position: Sitting, Cuff Size: Normal)   Pulse 75   Temp (!) 97.2 F (36.2 C)   Ht 5\' 6"  (1.676 m)   Wt 230 lb (104.3 kg)   SpO2 96%   BMI 37.12 kg/m   BMI: Body mass index is 37.12 kg/m.  Physical Exam  Constitutional: She is oriented to person, place, and time. She appears well-developed and well-nourished.  HENT:  Head: Normocephalic and atraumatic.  Eyes: Right eye exhibits no discharge. Left eye exhibits no discharge.  Neck: Normal range of motion. No JVD present.  Cardiovascular:  Normal rate, regular rhythm, S1 normal, S2 normal and normal heart sounds. Exam reveals no distant heart sounds, no friction rub, no midsystolic click and no opening snap.  No murmur heard. Pulses:      Posterior tibial pulses are 2+ on the right side and 2+ on the left side.  Pulmonary/Chest: Effort normal and breath sounds normal. No respiratory distress. She has no decreased breath sounds. She has no wheezes. She has no rales. She exhibits no tenderness.  Abdominal: Soft. She exhibits no distension. There is no abdominal tenderness.  Musculoskeletal:        General: No edema.  Neurological: She is alert and oriented to person, place, and time.  Skin: Skin is warm and dry. No cyanosis. Nails show no clubbing.  Psychiatric: She has a normal mood and affect. Her speech is normal and behavior is normal. Judgment and thought content normal.    Wt Readings from Last 3 Encounters:  12/15/18 230 lb (104.3 kg)  10/07/18 212 lb (96.2 kg)  10/04/17 210 lb (95.3 kg)     ASSESSMENT & PLAN:   1. Tachypalpitations: Outpatient cardiac monitoring showed intermittent delta waves consistent with preexcitation.  Echo showed preserved LV systolic function with mild to moderate mitral regurgitation.  Symptoms are much improved following the initiation of metoprolol succinate.  Refer to EP.  Check TSH, BMP, magnesium, and CBC.  Case was discussed with patient's primary cardiologist over the phone prior to her appointment today.  2. Mitral regurgitation: Follow with periodic echocardiogram.  Disposition: F/u with Dr. 10/06/17 or an APP in 3 months.   Medication Adjustments/Labs and Tests Ordered: Current medicines are reviewed at length with the patient today.  Concerns regarding medicines are outlined above. Medication changes, Labs and Tests ordered today are summarized above and listed in the Patient Instructions accessible in Encounters.   Signed, Kirke Corin, PA-C 12/15/2018 11:11 AM     [facility]  - [place] [address] [place], [place] [phone]

## 2018-12-15 ENCOUNTER — Encounter: Payer: Self-pay | Admitting: Physician Assistant

## 2018-12-15 ENCOUNTER — Ambulatory Visit (INDEPENDENT_AMBULATORY_CARE_PROVIDER_SITE_OTHER): Payer: BC Managed Care – PPO | Admitting: Physician Assistant

## 2018-12-15 ENCOUNTER — Other Ambulatory Visit: Payer: Self-pay

## 2018-12-15 VITALS — BP 120/66 | HR 75 | Temp 97.2°F | Ht 66.0 in | Wt 230.0 lb

## 2018-12-15 DIAGNOSIS — R0602 Shortness of breath: Secondary | ICD-10-CM

## 2018-12-15 DIAGNOSIS — I340 Nonrheumatic mitral (valve) insufficiency: Secondary | ICD-10-CM | POA: Diagnosis not present

## 2018-12-15 DIAGNOSIS — I456 Pre-excitation syndrome: Secondary | ICD-10-CM

## 2018-12-15 DIAGNOSIS — R002 Palpitations: Secondary | ICD-10-CM | POA: Diagnosis not present

## 2018-12-15 MED ORDER — METOPROLOL SUCCINATE ER 25 MG PO TB24: 25.0000 mg | ORAL_TABLET | Freq: Every day | ORAL | 5 refills | Status: DC

## 2018-12-15 NOTE — Patient Instructions (Signed)
Medication Instructions:  Your physician recommends that you continue on your current medications as directed. Please refer to the Current Medication list given to you today.  If you need a refill on your cardiac medications before your next appointment, please call your pharmacy.   Lab work: Your physician recommends that you return for lab work in: TODAY - TSH, BMET, Mg, CBC.  If you have labs (blood work) drawn today and your tests are completely normal, you will receive your results only by: Marland Kitchen MyChart Message (if you have MyChart) OR . A paper copy in the mail If you have any lab test that is abnormal or we need to change your treatment, we will call you to review the results.  Testing/Procedures: NONE  Follow-Up: You have been referred to [facility].    At [facility], you and your health needs are our priority.  As part of our continuing mission to provide you with exceptional heart care, we have created designated Provider Care Teams.  These Care Teams include your primary Cardiologist (physician) and Advanced Practice Providers (APPs -  Physician Assistants and Nurse Practitioners) who all work together to provide you with the care you need, when you need it. You will need a follow up appointment in 3 months with Dr Fletcher Anon after been seen by Dr Caryl Comes.   ou may see Kathlyn Sacramento, MD or one of the following Advanced Practice Providers on your designated Care Team:   Murray Hodgkins, NP Christell Faith, PA-C . Marrianne Mood, PA-C

## 2018-12-16 ENCOUNTER — Encounter: Payer: Self-pay | Admitting: Family Medicine

## 2018-12-16 LAB — BASIC METABOLIC PANEL
BUN/Creatinine Ratio: 14 (ref 9–23)
BUN: 12 mg/dL (ref 6–20)
CO2: 21 mmol/L (ref 20–29)
Calcium: 9.6 mg/dL (ref 8.7–10.2)
Chloride: 104 mmol/L (ref 96–106)
Creatinine, Ser: 0.86 mg/dL (ref 0.57–1.00)
GFR calc Af Amer: 109 mL/min/{1.73_m2} (ref >59)
GFR calc non Af Amer: 95 mL/min/{1.73_m2} (ref >59)
Glucose: 96 mg/dL (ref 65–99)
Potassium: 4.6 mmol/L (ref 3.5–5.2)
Sodium: 139 mmol/L (ref 134–144)

## 2018-12-16 LAB — CBC WITH DIFFERENTIAL/PLATELET
Basophils Absolute: 0.1 10*3/uL (ref 0.0–0.2)
Basos: 1 %
EOS (ABSOLUTE): 0.1 10*3/uL (ref 0.0–0.4)
Eos: 1 %
Hematocrit: 41.9 % (ref 34.0–46.6)
Hemoglobin: 13.8 g/dL (ref 11.1–15.9)
Immature Grans (Abs): 0 10*3/uL (ref 0.0–0.1)
Immature Granulocytes: 0 %
Lymphocytes Absolute: 3.4 10*3/uL — ABNORMAL HIGH (ref 0.7–3.1)
Lymphs: 37 %
MCH: 28.7 pg (ref 26.6–33.0)
MCHC: 32.9 g/dL (ref 31.5–35.7)
MCV: 87 fL (ref 79–97)
Monocytes Absolute: 0.6 10*3/uL (ref 0.1–0.9)
Monocytes: 7 %
Neutrophils Absolute: 4.8 10*3/uL (ref 1.4–7.0)
Neutrophils: 54 %
Platelets: 257 10*3/uL (ref 150–450)
RBC: 4.81 x10E6/uL (ref 3.77–5.28)
RDW: 12.7 % (ref 11.7–15.4)
WBC: 9 10*3/uL (ref 3.4–10.8)

## 2018-12-16 LAB — MAGNESIUM: Magnesium: 2.2 mg/dL (ref 1.6–2.3)

## 2018-12-16 LAB — TSH: TSH: 2.73 u[IU]/mL (ref 0.450–4.500)

## 2018-12-30 ENCOUNTER — Ambulatory Visit (INDEPENDENT_AMBULATORY_CARE_PROVIDER_SITE_OTHER): Payer: BC Managed Care – PPO

## 2018-12-30 DIAGNOSIS — Z23 Encounter for immunization: Secondary | ICD-10-CM

## 2018-12-30 NOTE — Progress Notes (Signed)
Per orders of Dr. Glori Bickers, injection of HPV #2 given by Kris Mouton. Patient tolerated injection well.  Patient has an appointment scheduled already for HPV #3.

## 2019-01-04 ENCOUNTER — Telehealth: Payer: Self-pay

## 2019-01-04 NOTE — Telephone Encounter (Signed)
Per Dr. Caryl Comes, pt should be seen by Dr. Lovena Le for possible WPW ablation. Pt has been scheduled for November 24.

## 2019-01-05 ENCOUNTER — Ambulatory Visit: Payer: BC Managed Care – PPO | Admitting: Internal Medicine

## 2019-01-06 ENCOUNTER — Other Ambulatory Visit: Payer: BC Managed Care – PPO

## 2019-02-03 ENCOUNTER — Ambulatory Visit: Payer: BC Managed Care – PPO | Admitting: Internal Medicine

## 2019-03-19 ENCOUNTER — Ambulatory Visit (INDEPENDENT_AMBULATORY_CARE_PROVIDER_SITE_OTHER): Payer: BC Managed Care – PPO | Admitting: Internal Medicine

## 2019-03-19 ENCOUNTER — Other Ambulatory Visit: Payer: Self-pay

## 2019-03-19 ENCOUNTER — Encounter: Payer: Self-pay | Admitting: Internal Medicine

## 2019-03-19 VITALS — BP 102/70 | HR 74 | Ht 68.0 in | Wt 234.0 lb

## 2019-03-19 DIAGNOSIS — I456 Pre-excitation syndrome: Secondary | ICD-10-CM

## 2019-03-19 NOTE — Progress Notes (Signed)
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 Unclear as to the treatment but she does not appear to have had an ablation. She was noted to have intermittent ventricular pre-excitation which was only noted during slow sinus rates and at times when her vagal tone was increased. She did not have SVT when she wore her cardiac monitor. She states that she had heart racing of 300/min as a baby. She took medication until she was 9. She was started on a low dose of beta blocker and notes that her symptoms of palpitations have improved.  Allergies  Allergen Reactions  . Other Other (See Comments)    Antihistamines or other stimulants-heart races     Current Outpatient Medications  Medication Sig Dispense Refill  . Apple Cider Vinegar 500 MG TABS Take 500 mg by mouth daily.    . Ascorbic Acid (VITAMIN C PO) Take by mouth daily.    . IBUPROFEN PO Take by mouth as needed.    . metoprolol succinate (TOPROL XL) 25 MG 24 hr tablet Take 1 tablet (25 mg total) by mouth daily. 30 tablet 5  . norethindrone-ethinyl estradiol (JUNEL FE 1/20) 1-20 MG-MCG tablet Take 1 tablet by mouth daily. 84 tablet 3   No current facility-administered medications for this visit.     Past Medical History:  Diagnosis Date  . Heart murmur of newborn   . History of chicken pox    around age 42  . WPW (Wolff-Parkinson-White syndrome)    Hosp at [facility] at one month of age for [place] and pulmonary hemorrhage    ROS:   All systems reviewed and negative except as noted in the HPI.   History reviewed. No pertinent surgical history.   Family History  Problem Relation Age of Onset  . Stroke Paternal Grandfather   . High blood pressure Paternal Grandfather   . High blood pressure Father   . Cervical cancer Mother      Social History   Socioeconomic History  . Marital status: Single    Spouse name: Not on file  . Number of  children: Not on file  . Years of education: Not on file  . Highest education level: Not on file  Occupational History  . Not on file  Tobacco Use  . Smoking status: Passive Smoke Exposure - Never Smoker  . Smokeless tobacco: Never Used  . Tobacco comment: pt smokes vapors occ with nicotine 3 mg   Substance and Sexual Activity  . Alcohol use: Yes    Alcohol/week: 0.0 standard drinks    Comment: occ  . Drug use: No  . Sexual activity: Yes    Birth control/protection: Pill  Other Topics Concern  . Not on file  Social History Narrative  . Not on file   Social Determinants of Health   Financial Resource Strain:   . Difficulty of Paying Living Expenses: Not on file  Food Insecurity:   . Worried About Charity fundraiser in the Last Year: Not on file  . Ran Out of Food in the Last Year: Not on file  Transportation Needs:   . Lack of Transportation (Medical): Not on file  . Lack of Transportation (Non-Medical): Not on file  Physical Activity:   . Days of Exercise per Week: Not on file  . Minutes of Exercise per Session: Not on file  Stress:   .  Feeling of Stress : Not on file  Social Connections:   . Frequency of Communication with Friends and Family: Not on file  . Frequency of Social Gatherings with Friends and Family: Not on file  . Attends Religious Services: Not on file  . Active Member of Clubs or Organizations: Not on file  . Attends Banker Meetings: Not on file  . Marital Status: Not on file  Intimate Partner Violence:   . Fear of Current or Ex-Partner: Not on file  . Emotionally Abused: Not on file  . Physically Abused: Not on file  . Sexually Abused: Not on file     BP 102/70   Pulse 74   Ht 5\' 8"  (1.727 m)   Wt 234 lb (106.1 kg)   SpO2 98%   BMI 35.58 kg/m   Physical Exam:  Well appearing NAD HEENT: Unremarkable Neck:  No JVD, no thyromegally Lymphatics:  No adenopathy Back:  No CVA tenderness Lungs:  Clear HEART:  Regular rate  rhythm, no murmurs, no rubs, no clicks Abd:  soft, positive bowel sounds, no organomegally, no rebound, no guarding Ext:  2 plus pulses, no edema, no cyanosis, no clubbing Skin:  No rashes no nodules Neuro:  CN II through XII intact, motor grossly intact  EKG - nsr with no pre-excitation  Assess/Plan: 1. WPW - she carries this diagnosis though she certainly does not appear to have a life threatening pathway and I have reviewed this with her. She has questions about contininuing her beta blocker if she becomes pregnant. I told her that if she becomes pregnant we would not want her to take a beta blocker in the first trimester. She will undergo watchful waiting and call if she would like to proceed with catheter ablation. 2. Palpitations - Unclear if the etiology is related to her PVC's or if she is having SVT.   [place].D.

## 2019-03-19 NOTE — Patient Instructions (Addendum)

## 2019-04-06 ENCOUNTER — Telehealth: Payer: Self-pay

## 2019-04-06 NOTE — Telephone Encounter (Signed)
Tried to contact pt for covid screening before nurse visit tomorrow. LVM

## 2019-04-07 ENCOUNTER — Ambulatory Visit: Payer: BC Managed Care – PPO

## 2019-04-07 ENCOUNTER — Other Ambulatory Visit: Payer: Self-pay

## 2019-04-28 ENCOUNTER — Other Ambulatory Visit: Payer: Self-pay

## 2019-04-28 ENCOUNTER — Ambulatory Visit (INDEPENDENT_AMBULATORY_CARE_PROVIDER_SITE_OTHER): Payer: BC Managed Care – PPO | Admitting: *Deleted

## 2019-04-28 DIAGNOSIS — Z23 Encounter for immunization: Secondary | ICD-10-CM

## 2019-04-28 NOTE — Progress Notes (Signed)
Per orders of Dr. Milinda Antis, injection of HPV (#3) given by Maisie Fus. Patient tolerated injection well.

## 2019-05-22 ENCOUNTER — Ambulatory Visit: Payer: BC Managed Care – PPO | Admitting: Nurse Practitioner

## 2019-05-22 ENCOUNTER — Other Ambulatory Visit: Payer: Self-pay

## 2019-05-22 ENCOUNTER — Encounter: Payer: Self-pay | Admitting: Nurse Practitioner

## 2019-05-22 VITALS — BP 108/76 | HR 93 | Temp 97.0°F | Ht 68.0 in | Wt 241.8 lb

## 2019-05-22 DIAGNOSIS — M546 Pain in thoracic spine: Secondary | ICD-10-CM

## 2019-05-22 NOTE — Progress Notes (Signed)
New Patient Office Visit  Subjective:  Patient ID: Abigail Pham, female    DOB: 03/13/1995  Age: 24 y.o. MRN: [id number]  CC:  Chief Complaint  Patient presents with  . Back Pain    HPI Abigail Pham presents for acute thoracic left sided pain onset a few hours ago. 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 She felt the sharp pain in her left thoracic area- bra line. It is aching, sharp and stabbing rated 9 out 10 today before tylenol. Now it is at a 5 out of 10 at rest after Tylenol 500 mg x 2 . No pain in her shoulder, neck or lower abdomen. No previous back injury or trauma. Twisting her torso from side to side brings it on. She feels well otherwise.  No SOB, DOE, or anterior chest pain.   LMP 04/26/2019 on BCP  Past Medical History:  Diagnosis Date  . Heart murmur of newborn   . History of chicken pox    around age 67  . WPW (Wolff-Parkinson-White syndrome)    Hosp at [facility] at one month of age for Tachyarrrythmia and pulmonary hemorrhage    History reviewed. No pertinent surgical history.  Family History  Problem Relation Age of Onset  . Stroke Paternal Grandfather   . High blood pressure Paternal Grandfather   . High blood pressure Father   . Cervical cancer Mother     Social History   Socioeconomic History  . Marital status: Single    Spouse name: Not on file  . Number of children: Not on file  . Years of education: Not on file  . Highest education level: Not on file  Occupational History  . Not on file  Tobacco Use  . Smoking status: Passive Smoke Exposure - Never Smoker  . Smokeless tobacco: Never Used  . Tobacco comment: pt smokes vapors occ with nicotine 3 mg   Substance and Sexual Activity  . Alcohol use: Yes    Alcohol/week: 0.0 standard drinks    Comment: occ  . Drug use: No  . Sexual activity: Yes    Birth control/protection:  Pill  Other Topics Concern  . Not on file  Social History Narrative  . Not on file   Social Determinants of Health   Financial Resource Strain:   . Difficulty of Paying Living Expenses:   Food Insecurity:   . Worried About Programme researcher, broadcasting/film/video in the Last Year:   . Barista in the Last Year:   Transportation Needs:   . Freight forwarder (Medical):   Marland Kitchen Lack of Transportation (Non-Medical):   Physical Activity:   . Days of Exercise per Week:   . Minutes of Exercise per Session:   Stress:   . Feeling of Stress :   Social Connections:   . Frequency of Communication with Friends and Family:   . Frequency of Social Gatherings with Friends and Family:   . Attends Religious Services:   . Active Member of Clubs or Organizations:   . Attends Banker Meetings:   Marland Kitchen Marital Status:   Intimate Partner Violence:   . Fear of Current or Ex-Partner:   . Emotionally Abused:   Marland Kitchen Physically Abused:   . Sexually Abused:      Review of Systems  Constitutional: Negative.   Respiratory:  Negative for cough, chest tightness and shortness of breath.   Cardiovascular: Negative for chest pain and palpitations.  Musculoskeletal:       Positive thoracic back- see HPI  Neurological: Negative.   Psychiatric/Behavioral: Negative.        She denies depression/anxiety and is in a happy and safe marriage.     Objective:   Today's Vitals: BP 108/76   Pulse 93   Temp (!) 97 F (36.1 C) (Skin)   Ht 5\' 8"  (1.727 m)   Wt 241 lb 12.8 oz (109.7 kg)   SpO2 97%   BMI 36.77 kg/m   Physical Exam Musculoskeletal:        General: Tenderness present. No swelling or deformity. Normal range of motion.     Right lower leg: No edema.     Left lower leg: No edema.     Comments: Left thoracic para spinous mild tenderness in muscle only. No rash or bruises. Normal neck, shoulder and back ROM. No tenderness lumbar or shoulder area. Reflexes patellar normal.     Assessment & Plan:    Problem List Items Addressed This Visit    None    Visit Diagnoses    Acute left-sided thoracic back pain    -  Primary      Outpatient Encounter Medications as of 05/22/2019  Medication Sig  . Ascorbic Acid (VITAMIN C PO) Take by mouth daily.  . IBUPROFEN PO Take by mouth as needed.  . metoprolol succinate (TOPROL XL) 25 MG 24 hr tablet Take 1 tablet (25 mg total) by mouth daily.  . norethindrone-ethinyl estradiol (JUNEL FE 1/20) 1-20 MG-MCG tablet Take 1 tablet by mouth daily.  . [DISCONTINUED] Apple Cider Vinegar 500 MG TABS Take 500 mg by mouth daily.   No facility-administered encounter medications on file as of 05/22/2019.   Take it easy today and use Tylenol and Advil for pain as needed for muscle strain. Your exam did not show anything worrisome.   You can try heat as needed.   Work note provided that  you were seen today and may return to work tomorrow.   Call me if no improvement by Monday.   Follow-up: Return if symptoms worsen or fail to improve.   This visit occurred during the SARS-CoV-2 public health emergency.  Safety protocols were in place, including screening questions prior to the visit, additional usage of staff PPE, and extensive cleaning of exam room while observing appropriate contact time as indicated for disinfecting solutions.   Denice Paradise, NP

## 2019-05-22 NOTE — Patient Instructions (Signed)
It was nice to meet you today.  Take it easy today and use Tylenol and Advil for pain as needed for muscle strain. Your exam did not show anything worrisome.   You can try heat as needed.   Work note provided that  you were seen today and may return to work tomorrow.   Call me if no improvement by Monday.

## 2019-07-08 ENCOUNTER — Telehealth: Payer: Self-pay | Admitting: Cardiovascular Disease

## 2019-07-08 NOTE — Telephone Encounter (Signed)
Patient declined appt as she is fu with Abigail Pham.  Deleting recall per patient request.

## 2019-08-25 ENCOUNTER — Ambulatory Visit: Payer: BC Managed Care – PPO | Admitting: Internal Medicine

## 2019-08-28 ENCOUNTER — Telehealth: Payer: Self-pay

## 2019-08-28 NOTE — Telephone Encounter (Signed)
Go ahead and just stop it  There will be bleeding/a period usually shortly after stopping Then period may be irregular for a while   If she needs to prevent pregnancy-use condoms or other back up protection   Keep me posted

## 2019-08-28 NOTE — Telephone Encounter (Signed)
Pt left a VM on triage line asking about stopping BCP. I called her back because her PCP said someone else. She stated that was a mistake on [facility]'s part. I corrected that.  Pt is saying she has been on birth control for several years and would like to come off. Asking if she just stops taking it or if she needs to taper. Also, should she do an OV to discuss it.

## 2019-08-28 NOTE — Telephone Encounter (Signed)
Pt notified of Dr. Tower's instructions and verbalized understanding  

## 2019-09-01 ENCOUNTER — Other Ambulatory Visit: Payer: Self-pay | Admitting: Family Medicine

## 2019-09-01 NOTE — Telephone Encounter (Signed)
Per 08/28/19 phone note pt wanted to d/c BCP, Rx declined

## 2019-09-22 ENCOUNTER — Ambulatory Visit (INDEPENDENT_AMBULATORY_CARE_PROVIDER_SITE_OTHER): Payer: BC Managed Care – PPO | Admitting: Student

## 2019-09-22 ENCOUNTER — Other Ambulatory Visit: Payer: Self-pay

## 2019-09-22 ENCOUNTER — Encounter: Payer: Self-pay | Admitting: Student

## 2019-09-22 VITALS — BP 110/70 | HR 78 | Ht 68.0 in | Wt 243.0 lb

## 2019-09-22 DIAGNOSIS — R002 Palpitations: Secondary | ICD-10-CM | POA: Diagnosis not present

## 2019-09-22 DIAGNOSIS — I456 Pre-excitation syndrome: Secondary | ICD-10-CM | POA: Diagnosis not present

## 2019-09-22 MED ORDER — METOPROLOL SUCCINATE ER 25 MG PO TB24
37.5000 mg | ORAL_TABLET | Freq: Every day | ORAL | 3 refills | Status: DC
Start: 2019-09-22 — End: 2019-09-28

## 2019-09-22 NOTE — Progress Notes (Signed)
PCP:  Judy Pimple, MD Primary Cardiologist: Lorine Bears, MD Electrophysiologist: Lewayne Bunting, MD   Abigail Pham is a 24 y.o. female seen today for Lewayne Bunting, MD for routine electrophysiology followup.  Since last being seen in our clinic the patient reports doing about the same. She initially felt like toprol helped with her palpitations, but they have started back up again, especially in the heat of the summer. These occur daily, multiple times during the day and at times involve weakness, dizziness, lightheadedness, and tachy palpitations.  She has come off birth control and would like to consider getting pregnant, another reason that she would like to pursue catheter ablation.   Past Medical History:  Diagnosis Date  . Heart murmur of newborn   . History of chicken pox    around age 77  . WPW (Wolff-Parkinson-White syndrome)    Hosp at [facility] at one month of age for Tachyarrrythmia and pulmonary hemorrhage   No past surgical history on file.  Current Outpatient Medications  Medication Sig Dispense Refill  . Ascorbic Acid (VITAMIN C PO) Take by mouth daily.    . IBUPROFEN PO Take by mouth as needed.    . metoprolol succinate (TOPROL XL) 25 MG 24 hr tablet Take 1.5 tablets (37.5 mg total) by mouth daily. 45 tablet 3   No current facility-administered medications for this visit.    Allergies  Allergen Reactions  . Other Other (See Comments)    Antihistamines or other stimulants-heart races    Social History   Socioeconomic History  . Marital status: Married    Spouse name: Not on file  . Number of children: Not on file  . Years of education: Not on file  . Highest education level: Not on file  Occupational History  . Not on file  Tobacco Use  . Smoking status: Passive Smoke Exposure - Never Smoker  . Smokeless tobacco: Never Used  . Tobacco comment: pt smokes vapors occ with nicotine 3 mg   Vaping Use  . Vaping Use: Every day  Substance and Sexual Activity    . Alcohol use: Yes    Alcohol/week: 0.0 standard drinks    Comment: occ  . Drug use: No  . Sexual activity: Yes    Birth control/protection: Pill  Other Topics Concern  . Not on file  Social History Narrative  . Not on file   Social Determinants of Health   Financial Resource Strain:   . Difficulty of Paying Living Expenses:   Food Insecurity:   . Worried About Programme researcher, broadcasting/film/video in the Last Year:   . Barista in the Last Year:   Transportation Needs:   . Freight forwarder (Medical):   Marland Kitchen Lack of Transportation (Non-Medical):   Physical Activity:   . Days of Exercise per Week:   . Minutes of Exercise per Session:   Stress:   . Feeling of Stress :   Social Connections:   . Frequency of Communication with Friends and Family:   . Frequency of Social Gatherings with Friends and Family:   . Attends Religious Services:   . Active Member of Clubs or Organizations:   . Attends Banker Meetings:   Marland Kitchen Marital Status:   Intimate Partner Violence:   . Fear of Current or Ex-Partner:   . Emotionally Abused:   Marland Kitchen Physically Abused:   . Sexually Abused:     Review of Systems: All other systems reviewed and are  otherwise negative except as noted above.  Physical Exam: Vitals:   09/22/19 1258  BP: 110/70  Pulse: 78  SpO2: 97%  Weight: 243 lb (110.2 kg)  Height: 5\' 8"  (1.727 m)    GEN- The patient is well appearing, alert and oriented x 3 today.   HEENT: normocephalic, atraumatic; sclera clear, conjunctiva pink; hearing intact; oropharynx clear; neck supple, no JVP Lymph- no cervical lymphadenopathy Lungs- Clear to ausculation bilaterally, normal work of breathing.  No wheezes, rales, rhonchi Heart- Regular rate and rhythm, no murmurs, rubs or gallops, PMI not laterally displaced GI- soft, non-tender, non-distended, bowel sounds present, no hepatosplenomegaly Extremities- no clubbing, cyanosis, or edema; DP/PT/radial pulses 2+ bilaterally MS- no  significant deformity or atrophy Skin- warm and dry, no rash or lesion Psych- euthymic mood, full affect Neuro- strength and sensation are intact  EKG is not ordered. Personal review of EKG from 03/16/2019 showed NSR with no pre-excitation  Additional studies reviewed include: Previous EP office notes, Echo 11/2018, Monitor 11/2018  Assessment and Plan:  1. Diagnosis of WPW Has not been clearly seen by our team. Pt was "noted to have intermittent ventricular pre-excitation which was only noted during slow sinus rates and at times when her vagal tone was increased. She did not have SVT when she wore her cardiac monitor" She would like to be off medicines and would like to try and become pregnant. She had previously discussed an attempt at ablation with Dr. 12/2018, and she would like pursue.  Encouraged hydration  For now, will also increase Toprol to 37.5 mg daily  2. Palpitations Unclear if PVCs or SVT as causative agent. Discussed with patient that an ablation would involve an EP study first to determine what arrhythmias were present, if they were thought to be causing her symptoms, and if they would be amenable to ablation. She is aware that success varies depending on the specific issue.   Will reach out to Dr. Ladona Ridgel and staff to discuss ablation and scheduling.   Ladona Ridgel, PA-C  09/22/19 2:20 PM

## 2019-09-22 NOTE — Patient Instructions (Addendum)
Medication Instructions:  Your physician has recommended you make the following change in your medication:  -- INCREASE Metoprolol (Toprol XL) to 37.5 mg - Take 1.5 tablets (37.5 mg) by mouth daily *If you need a refill on your cardiac medications before your next appointment, please call your pharmacy*  Lab Work: Your physician has recommended that you have lab work today: BMET, CBC, and TSH  If you have labs (blood work) drawn today and your tests are completely normal, you will receive your results only by: Marland Kitchen MyChart Message (if you have MyChart) OR . A paper copy in the mail If you have any lab test that is abnormal or we need to change your treatment, we will call you to review the results.  Follow-Up: At [facility], you and your health needs are our priority.  As part of our continuing mission to provide you with exceptional heart care, we have created designated Provider Care Teams.  These Care Teams include your primary Cardiologist (physician) and Advanced Practice Providers (APPs -  Physician Assistants and Nurse Practitioners) who all work together to provide you with the care you need, when you need it.  We recommend signing up for the patient portal called "MyChart".  Sign up information is provided on this After Visit Summary.  MyChart is used to connect with patients for Virtual Visits (Telemedicine).  Patients are able to view lab/test results, encounter notes, upcoming appointments, etc.  Non-urgent messages can be sent to your provider as well.   To learn more about what you can do with MyChart, go to [email or web address].    Your next appointment:   Someone from scheduling will contact you in regards to follow up once Otilio Saber, PA-C gets recommendations from Dr. Ladona Ridgel.  The format for your next appointment:   In Person with Lewayne Bunting, MD

## 2019-09-23 ENCOUNTER — Telehealth: Payer: Self-pay

## 2019-09-23 LAB — BASIC METABOLIC PANEL
BUN/Creatinine Ratio: 15 (ref 9–23)
BUN: 11 mg/dL (ref 6–20)
CO2: 22 mmol/L (ref 20–29)
Calcium: 9.2 mg/dL (ref 8.7–10.2)
Chloride: 104 mmol/L (ref 96–106)
Creatinine, Ser: 0.75 mg/dL (ref 0.57–1.00)
GFR calc Af Amer: 128 mL/min/{1.73_m2} (ref >59)
GFR calc non Af Amer: 111 mL/min/{1.73_m2} (ref >59)
Glucose: 89 mg/dL (ref 65–99)
Potassium: 4.7 mmol/L (ref 3.5–5.2)
Sodium: 140 mmol/L (ref 134–144)

## 2019-09-23 LAB — CBC WITH DIFFERENTIAL/PLATELET
Basophils Absolute: 0.1 10*3/uL (ref 0.0–0.2)
Basos: 1 %
EOS (ABSOLUTE): 0.1 10*3/uL (ref 0.0–0.4)
Eos: 1 %
Hematocrit: 44 % (ref 34.0–46.6)
Hemoglobin: 14.2 g/dL (ref 11.1–15.9)
Immature Grans (Abs): 0 10*3/uL (ref 0.0–0.1)
Immature Granulocytes: 0 %
Lymphocytes Absolute: 3 10*3/uL (ref 0.7–3.1)
Lymphs: 36 %
MCH: 28 pg (ref 26.6–33.0)
MCHC: 32.3 g/dL (ref 31.5–35.7)
MCV: 87 fL (ref 79–97)
Monocytes Absolute: 0.5 10*3/uL (ref 0.1–0.9)
Monocytes: 6 %
Neutrophils Absolute: 4.6 10*3/uL (ref 1.4–7.0)
Neutrophils: 56 %
Platelets: 253 10*3/uL (ref 150–450)
RBC: 5.08 x10E6/uL (ref 3.77–5.28)
RDW: 12.4 % (ref 11.7–15.4)
WBC: 8.3 10*3/uL (ref 3.4–10.8)

## 2019-09-23 LAB — TSH: TSH: 2.42 u[IU]/mL (ref 0.450–4.500)

## 2019-09-26 ENCOUNTER — Other Ambulatory Visit: Payer: Self-pay | Admitting: Physician Assistant

## 2019-09-28 MED ORDER — METOPROLOL SUCCINATE ER 25 MG PO TB24: 37.5000 mg | ORAL_TABLET | Freq: Every day | ORAL | 11 refills | Status: DC

## 2019-09-28 NOTE — Telephone Encounter (Signed)
Refill Request.  

## 2019-10-20 NOTE — Telephone Encounter (Signed)
Will have scheduling call Pt to see if she is interested in scheduling follow up visit with [facility] to discuss ablation.

## 2019-11-30 ENCOUNTER — Other Ambulatory Visit: Payer: Self-pay

## 2019-11-30 ENCOUNTER — Encounter: Payer: Self-pay | Admitting: Internal Medicine

## 2019-11-30 ENCOUNTER — Ambulatory Visit: Payer: BC Managed Care – PPO | Admitting: Internal Medicine

## 2019-11-30 VITALS — BP 112/78 | HR 69 | Ht 68.0 in | Wt 248.2 lb

## 2019-11-30 DIAGNOSIS — I456 Pre-excitation syndrome: Secondary | ICD-10-CM | POA: Diagnosis not present

## 2019-11-30 NOTE — Progress Notes (Signed)
HPI Ms. Prescott returns today for followup. She is a pleasant 24 yo woman with morbid obesity and a h/o SVT. She has subtle pre-excitation. She has been on a beta blocker and her symptoms are much improved. She has put on hold getting pregnant.  Allergies  Allergen Reactions   Other Other (See Comments)    Antihistamines or other stimulants-heart races     Current Outpatient Medications  Medication Sig Dispense Refill   Ascorbic Acid (VITAMIN C PO) Take by mouth daily.     IBUPROFEN PO Take by mouth as needed.     metoprolol succinate (TOPROL XL) 25 MG 24 hr tablet Take 1.5 tablets (37.5 mg total) by mouth daily. 45 tablet 11   No current facility-administered medications for this visit.     Past Medical History:  Diagnosis Date   Heart murmur of newborn    History of chicken pox    around age 28   WPW (Wolff-Parkinson-White syndrome)    Hosp at [facility] at one month of age for Tachyarrrythmia and pulmonary hemorrhage    ROS:   All systems reviewed and negative except as noted in the HPI.   History reviewed. No pertinent surgical history.   Family History  Problem Relation Age of Onset   Stroke Paternal Grandfather    High blood pressure Paternal Grandfather    High blood pressure Father    Cervical cancer Mother      Social History   Socioeconomic History   Marital status: Married    Spouse name: Not on file   Number of children: Not on file   Years of education: Not on file   Highest education level: Not on file  Occupational History   Not on file  Tobacco Use   Smoking status: Passive Smoke Exposure - Never Smoker   Smokeless tobacco: Never Used   Tobacco comment: pt smokes vapors occ with nicotine 3 mg   Vaping Use   Vaping Use: Every day  Substance and Sexual Activity   Alcohol use: Yes    Alcohol/week: 0.0 standard drinks    Comment: occ   Drug use: No   Sexual activity: Yes    Birth control/protection: Pill  Other  Topics Concern   Not on file  Social History Narrative   Not on file   Social Determinants of Health   Financial Resource Strain:    Difficulty of Paying Living Expenses: Not on file  Food Insecurity:    Worried About Programme researcher, broadcasting/film/video in the Last Year: Not on file   [employer] of Food in the Last Year: Not on file  Transportation Needs:    Lack of Transportation (Medical): Not on file   Lack of Transportation (Non-Medical): Not on file  Physical Activity:    Days of Exercise per Week: Not on file   Minutes of Exercise per Session: Not on file  Stress:    Feeling of Stress : Not on file  Social Connections:    Frequency of Communication with Friends and Family: Not on file   Frequency of Social Gatherings with Friends and Family: Not on file   Attends Religious Services: Not on file   Active Member of Clubs or Organizations: Not on file   Attends Banker Meetings: Not on file   Marital Status: Not on file  Intimate Partner Violence:    Fear of Current or Ex-Partner: Not on file   Emotionally Abused: Not on file  Physically Abused: Not on file   Sexually Abused: Not on file     BP 112/78    Pulse 69    Ht 5\' 8"  (1.727 m)    Wt 248 lb 3.2 oz (112.6 kg)    LMP 11/11/2019 (Exact Date)    SpO2 98%    BMI 37.74 kg/m   Physical Exam:  Morbidly obese appearing NAD HEENT: Unremarkable Neck:  No JVD, no thyromegally Lymphatics:  No adenopathy Back:  No CVA tenderness Lungs:  Clear with no wheezes HEART:  Regular rate rhythm, no murmurs, no rubs, no clicks Abd:  soft, positive bowel sounds, no organomegally, no rebound, no guarding Ext:  2 plus pulses, no edema, no cyanosis, no clubbing Skin:  No rashes no nodules Neuro:  CN II through XII intact, motor grossly intact  EKG - NSR with no pre-excitation   Assess/Plan: 1. SVT - her symptoms are well controlled. She will continue her beta blocker. We discussed the indications for catheter  ablation and for now she will undergo watchful waiting.  2. Obesity - I strongly encouraged the patient to lose weight.   01/11/2020 TaylorMD

## 2019-11-30 NOTE — Patient Instructions (Signed)

## 2019-12-29 ENCOUNTER — Ambulatory Visit: Payer: BC Managed Care – PPO | Admitting: Internal Medicine

## 2020-02-24 IMAGING — DX DG LUMBAR SPINE COMPLETE 4+V
5 series · 5 of 5 positions shown · non-contrast
Comparison: None.

CLINICAL DATA: 22-year-old female with low back pain and bilateral
sciatica.

EXAM:
LUMBAR SPINE - COMPLETE 4+ VIEW

[l-spine ap]
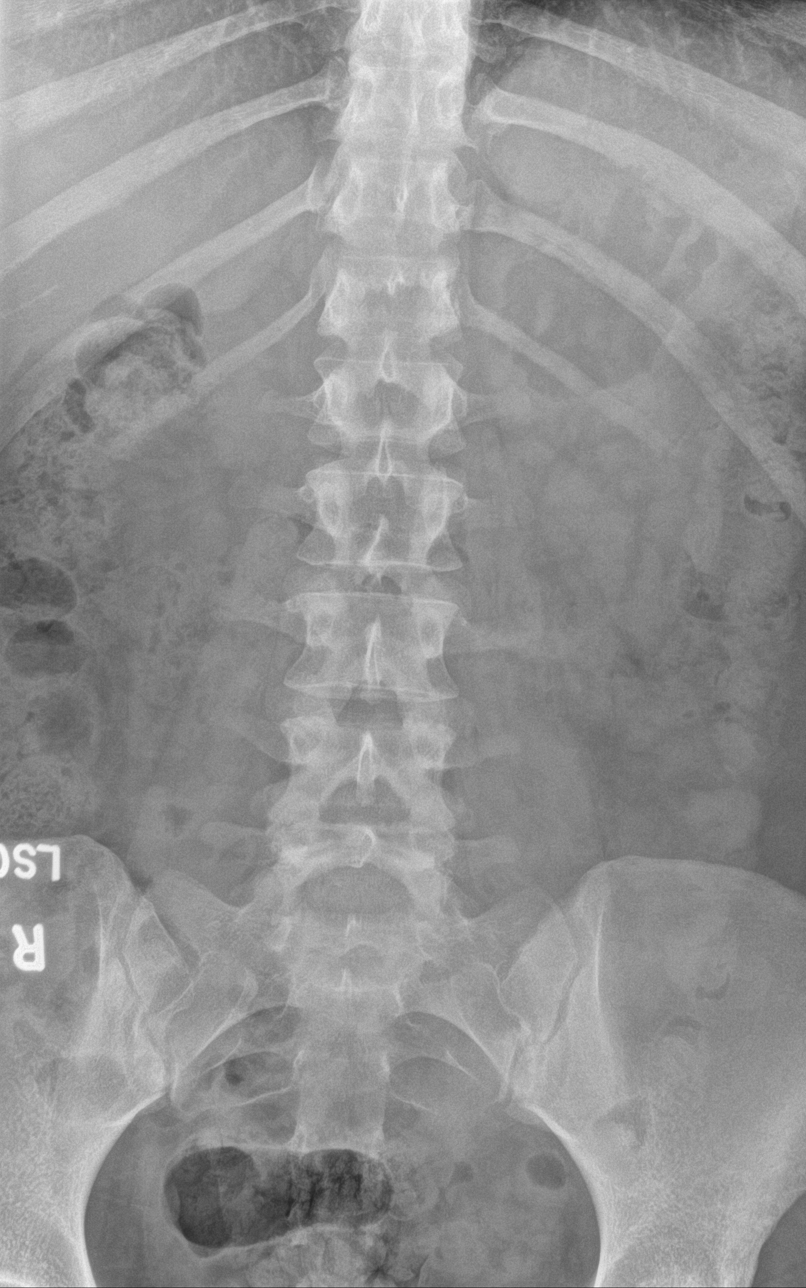

[l-spine obl (1 of 2)]
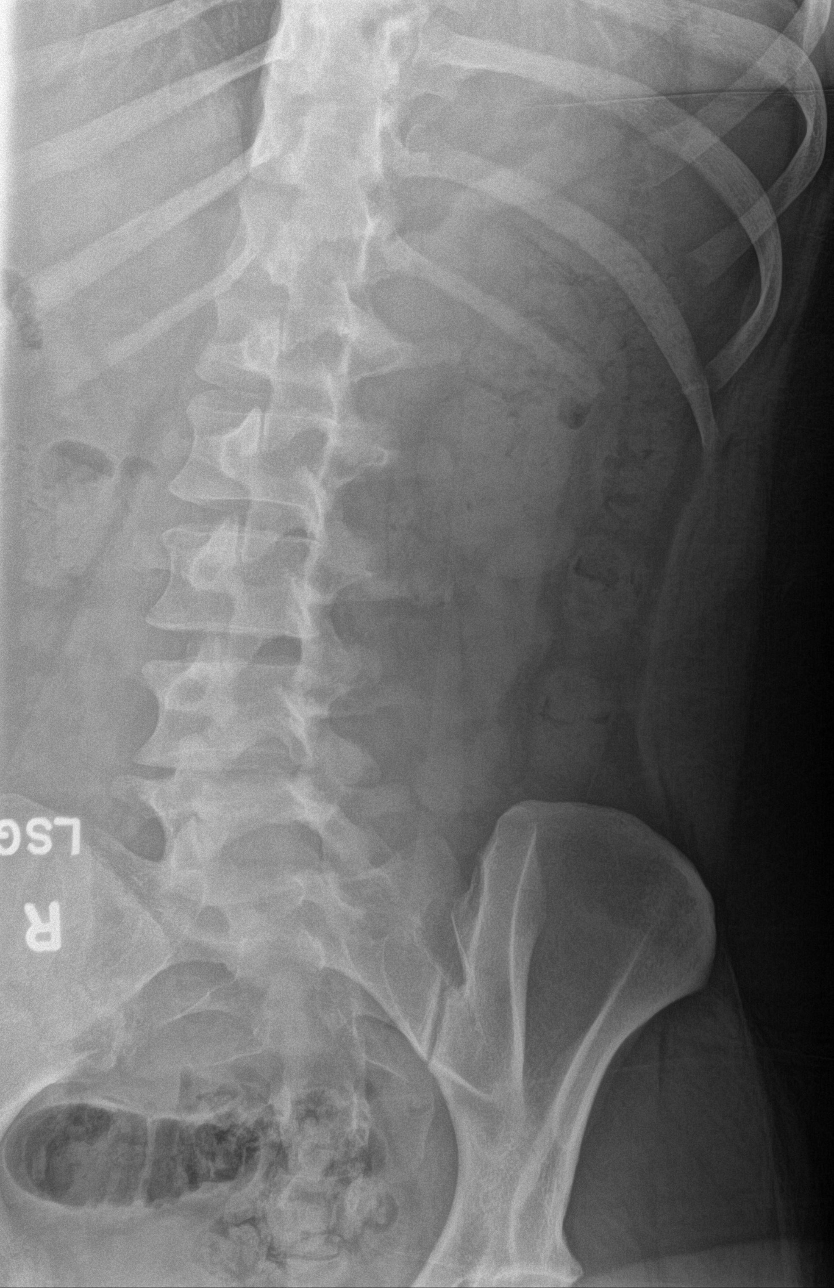

[l-spine obl (2 of 2)]
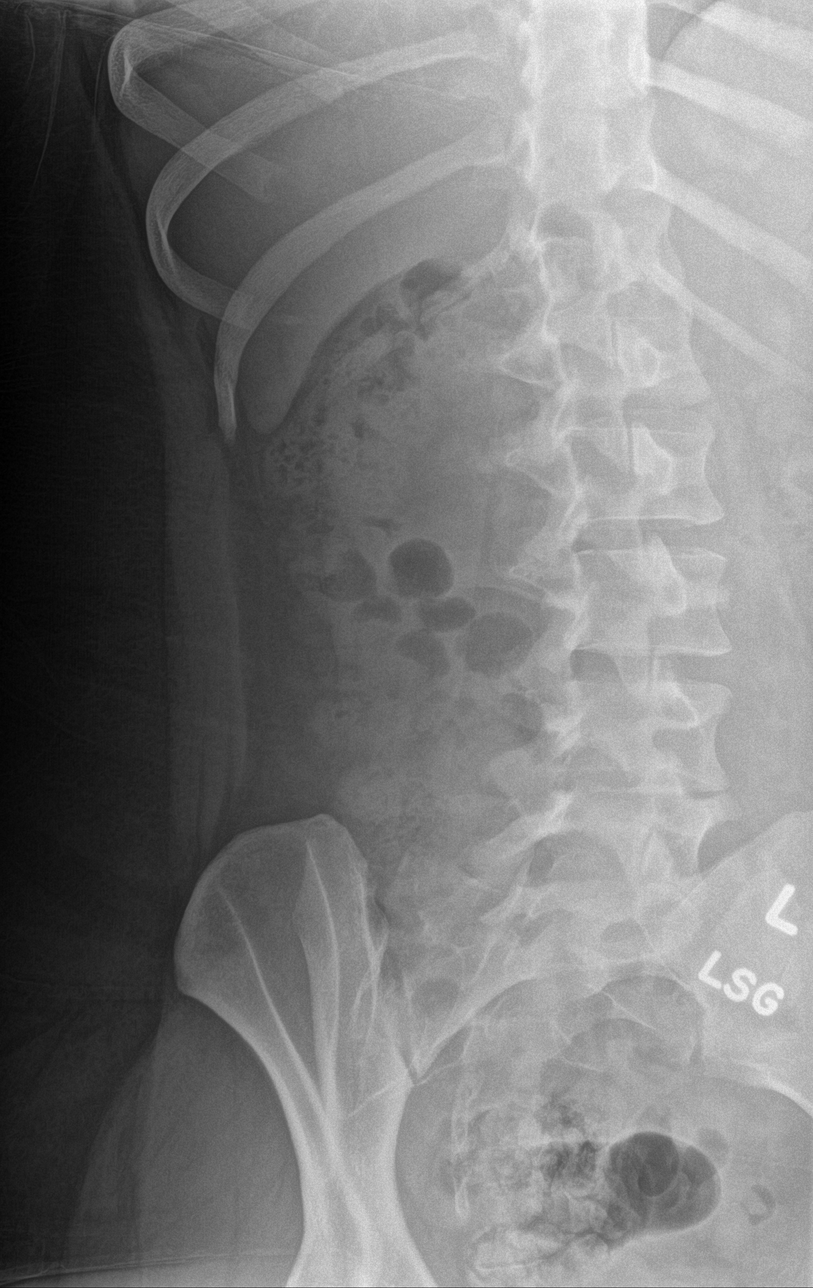

[l-spine lat]
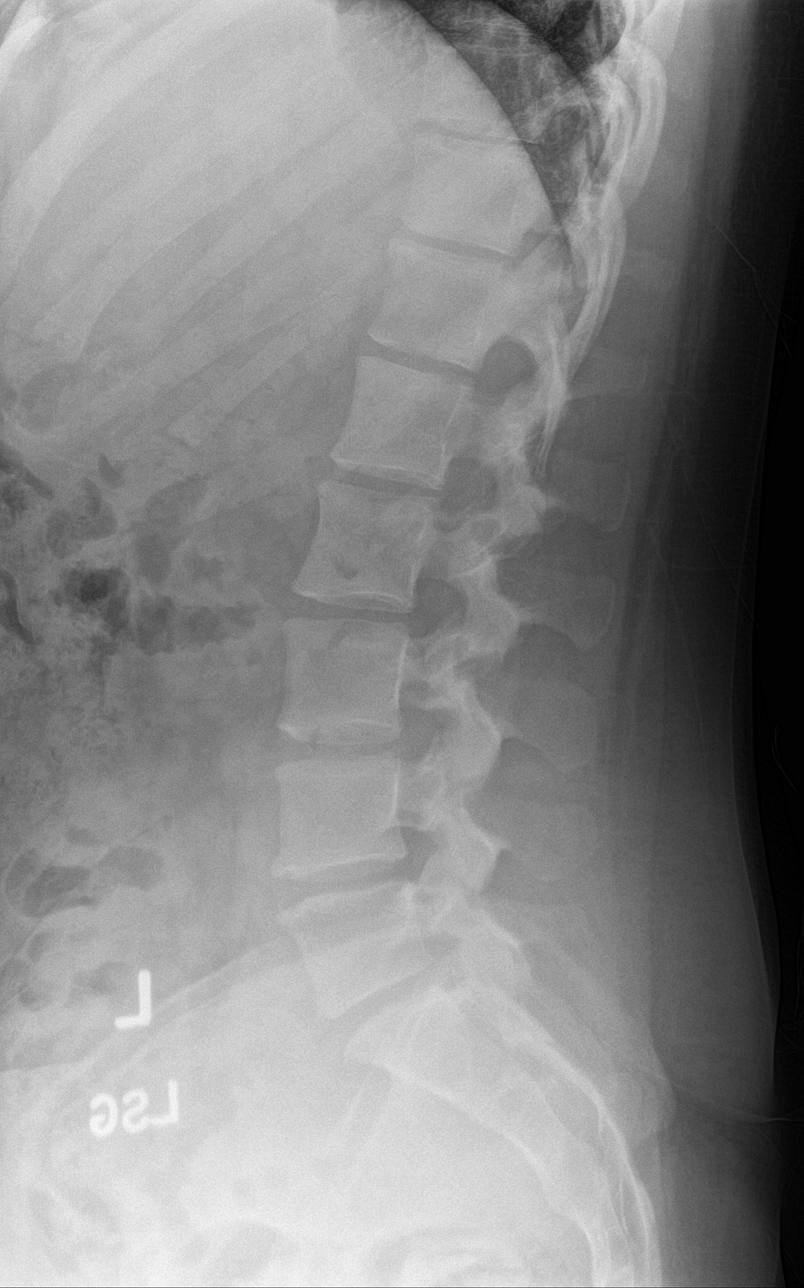

[l-spine l5/s1]
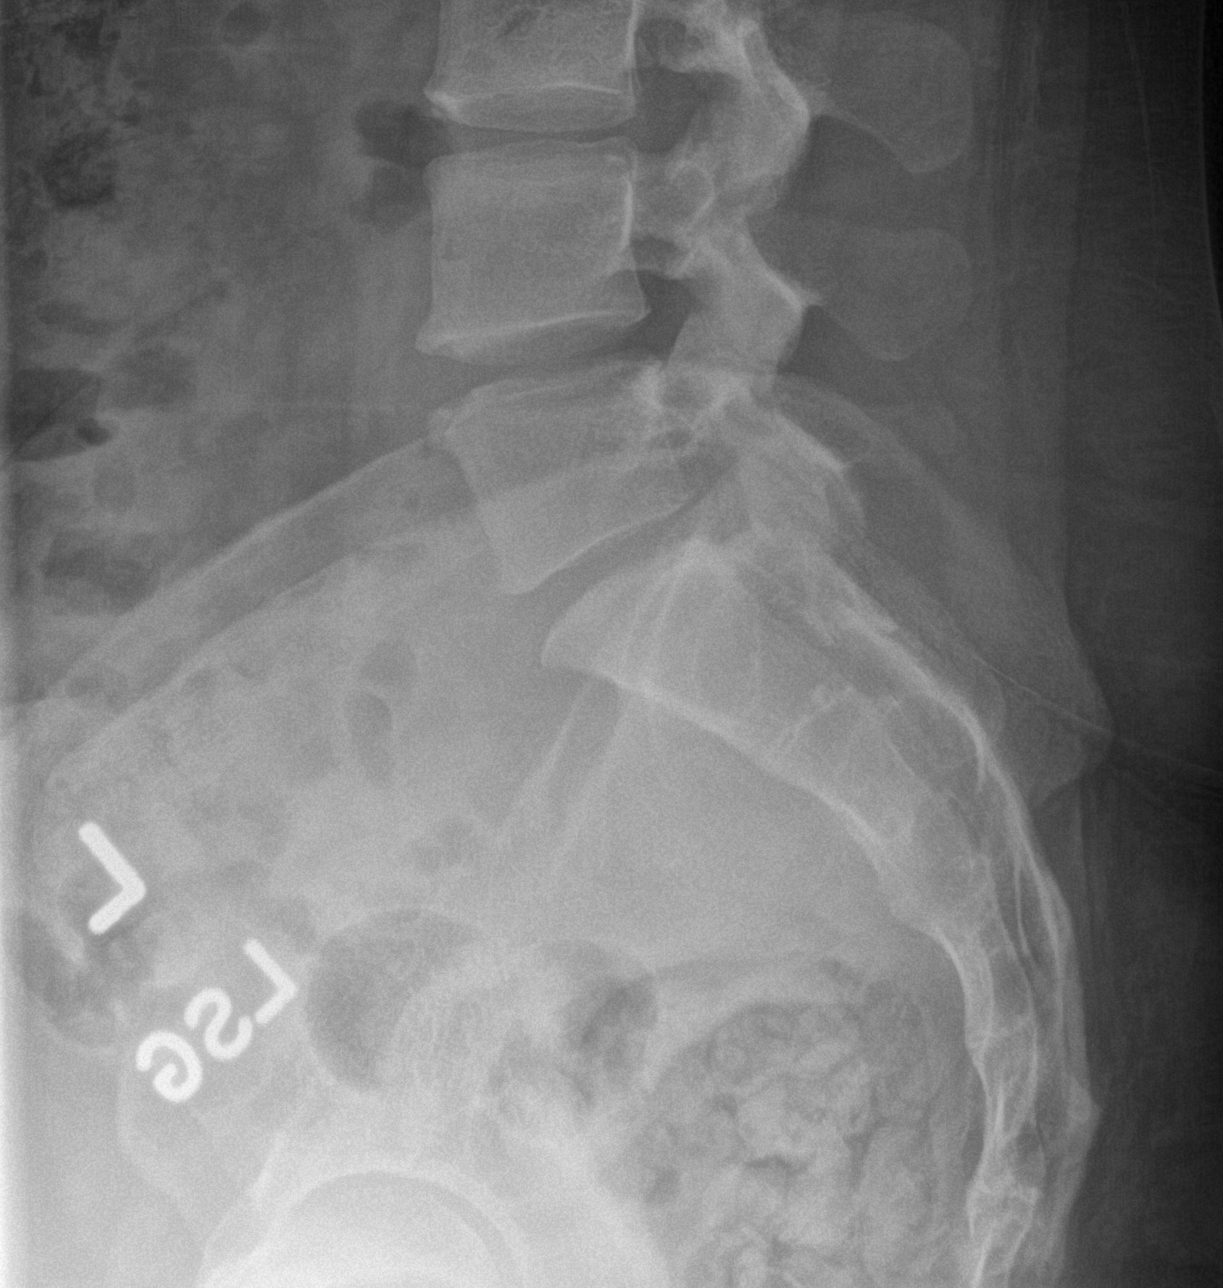

[5 of 5 positions shown; findings below may reference images not displayed]

FINDINGS: There is no evidence of lumbar spine fracture. Alignment is normal.
Intervertebral disc spaces are maintained.
IMPRESSION: Negative.

## 2020-03-02 ENCOUNTER — Telehealth: Payer: Self-pay | Admitting: Internal Medicine

## 2020-03-02 NOTE — Telephone Encounter (Signed)
Patient c/o Palpitations:  High priority if patient c/o lightheadedness, shortness of breath, or chest pain  1) How long have you had palpitations/irregular HR/ Afib? Are you having the symptoms now? 2 weeks, not having symptoms now  2) Are you currently experiencing lightheadedness, SOB or CP? At night chest gets extremely tight, lightheadedness mainly at night, no symptoms now  3) Do you have a history of afib (atrial fibrillation) or irregular heart rhythm? yes  4) Have you checked your BP or HR? (document readings if available): at night goes down to the 40's  5) Are you experiencing any other symptoms? no   Patient states for the past 2 weeks she has been having palpitations, primarily at night when she is trying to sleep. She states it happens during the day periodically, but it is manageable during the day. She states it is effecting her sleep and her job. She states she gets tightness in her chest and lightheadedness, but is not having the symptoms now. She states she is not having palpitations now. She states her HR at night has been going down to the 40's. She states her resting HR is usually around the 60's and her regular HR is around the 80's. She states her palpitations are constant at night. Please advise.

## 2020-03-03 NOTE — Telephone Encounter (Signed)
See mychart message.  Per Dr. Lorine Bears taking toprol in AM instead of PM  Also per Dr. Ladona Ridgel do not worry about HR's in the 40's at night.

## 2020-03-10 ENCOUNTER — Ambulatory Visit: Payer: BC Managed Care – PPO | Admitting: Physician Assistant

## 2020-09-30 ENCOUNTER — Other Ambulatory Visit: Payer: Self-pay

## 2020-09-30 MED ORDER — METOPROLOL SUCCINATE ER 25 MG PO TB24: 37.5000 mg | ORAL_TABLET | Freq: Every day | ORAL | 0 refills | Status: DC

## 2020-10-23 ENCOUNTER — Other Ambulatory Visit: Payer: Self-pay | Admitting: Internal Medicine

## 2020-11-18 ENCOUNTER — Other Ambulatory Visit: Payer: Self-pay | Admitting: Internal Medicine

## 2020-12-22 ENCOUNTER — Other Ambulatory Visit: Payer: Self-pay | Admitting: Internal Medicine

## 2021-01-17 ENCOUNTER — Telehealth: Payer: Self-pay | Admitting: Internal Medicine

## 2021-01-17 MED ORDER — METOPROLOL SUCCINATE ER 25 MG PO TB24: 37.5000 mg | ORAL_TABLET | Freq: Every day | ORAL | 0 refills | Status: DC

## 2021-01-17 NOTE — Telephone Encounter (Signed)
*  STAT* If patient is at the pharmacy, call can be transferred to refill team.   1. Which medications need to be refilled? (please list name of each medication and dose if known) metoprolol succinate (TOPROL-XL) 25 MG 24 hr tablet  2. Which pharmacy/location (including street and city if local pharmacy) is medication to be sent to? [id number] - [place], [place] - [address]  3. Do they need a 30 day or 90 day supply? 90 day  Only has one pill left.

## 2021-01-17 NOTE — Telephone Encounter (Signed)
Reviewed chart.  Patient has an appointment scheduled.   Rx(s) sent to pharmacy electronically.  Patient notified and voiced understanding.

## 2021-01-20 ENCOUNTER — Other Ambulatory Visit: Payer: Self-pay

## 2021-01-20 ENCOUNTER — Encounter: Payer: Self-pay | Admitting: Emergency Medicine

## 2021-01-20 ENCOUNTER — Emergency Department (INDEPENDENT_AMBULATORY_CARE_PROVIDER_SITE_OTHER)
Admission: EM | Admit: 2021-01-20 | Discharge: 2021-01-20 | Disposition: A | Discharge status: Home / Self Care | Payer: Self-pay | Source: Home / Self Care | Attending: Family Medicine | Admitting: Family Medicine

## 2021-01-20 DIAGNOSIS — M940 Chondrocostal junction syndrome [Tietze]: Secondary | ICD-10-CM

## 2021-01-20 DIAGNOSIS — J069 Acute upper respiratory infection, unspecified: Secondary | ICD-10-CM

## 2021-01-20 MED ORDER — AZITHROMYCIN 250 MG PO TABS: ORAL_TABLET | ORAL | 0 refills | Status: DC

## 2021-01-20 NOTE — ED Provider Notes (Signed)
[facility]    CSN: [id number] Arrival date & time: 01/20/21  1649      History   Chief Complaint Chief Complaint  Patient presents with   Nasal Congestion    HPI Abigail Pham is a 25 y.o. female.   Four days ago patient developed a mild sore throat followed by nasal congestion and headache.  She denies fever and myalgias. Yesterday she developed a cough, and now complains of pain in her anterior chest with cough and deep inspiration.  She has had two negative home COVID19 tests.  The history is provided by the patient.   Past Medical History:  Diagnosis Date   Heart murmur of newborn    History of chicken pox    around age 77   WPW (Wolff-Parkinson-White syndrome)    Hosp at [facility] at one month of age for Tachyarrrythmia and pulmonary hemorrhage    Patient Active Problem List   Diagnosis Date Noted   Oral contraceptive use 07/07/2018   Anxiety-like symptoms 03/26/2017   Encounter for annual routine gynecological examination 03/26/2017   Poor concentration 11/06/2013   History of ADHD 11/06/2013   WPW (Wolff-Parkinson-White syndrome) 11/09/2010    History reviewed. No pertinent surgical history.  OB History   No obstetric history on file.      Home Medications    Prior to Admission medications   Medication Sig Start Date End Date Taking? Authorizing Provider  azithromycin (ZITHROMAX Z-PAK) 250 MG tablet Take 2 tabs today; then begin one tab once daily for 4 more days. 01/20/21  Yes Lattie Haw, MD  Ascorbic Acid (VITAMIN C PO) Take by mouth daily.    [provider]  IBUPROFEN PO Take by mouth as needed.    [provider]  metoprolol succinate (TOPROL-XL) 25 MG 24 hr tablet Take 1.5 tablets (37.5 mg total) by mouth daily. 01/17/21   Marinus Maw, MD    Family History Family History  Problem Relation Age of Onset   Stroke Paternal Grandfather    High blood pressure Paternal Grandfather    High blood pressure Father     Cervical cancer Mother     Social History Social History   Tobacco Use   Smoking status: Never    Passive exposure: Yes   Smokeless tobacco: Never   Tobacco comments:    pt smokes vapors occ with nicotine 3 mg   Vaping Use   Vaping Use: Every day   Substances: Nicotine, Flavoring  Substance Use Topics   Alcohol use: Yes    Alcohol/week: 0.0 standard drinks    Comment: occ   Drug use: No     Allergies   Other   Review of Systems Review of Systems + sore throat + cough No pleuritic pain, but complains of pain in her anterior mid-chest with cough and inspiration No wheezing + nasal congestion + post-nasal drainage No sinus pain/pressure No itchy/red eyes No earache No hemoptysis No SOB No fever/chills No nausea No vomiting No abdominal pain No diarrhea No urinary symptoms No skin rash + fatigue No myalgias + headache   Physical Exam Triage Vital Signs ED Triage Vitals  Enc Vitals Group     BP 01/20/21 1759 119/75     Pulse Rate 01/20/21 1759 67     Resp 01/20/21 1759 18     Temp 01/20/21 1759 98.5 F (36.9 C)     Temp Source 01/20/21 1759 Oral     SpO2 01/20/21 1759 97 %  Weight 01/20/21 1800 238 lb (108 kg)     Height 01/20/21 1800 5\' 9"  (1.753 m)     Head Circumference --      Peak Flow --      Pain Score 01/20/21 1800 2     Pain Loc --      Pain Edu? --      Excl. in GC? --    No data found.  Updated Vital Signs BP 119/75 (BP Location: Left Arm)   Pulse 67   Temp 98.5 F (36.9 C) (Oral)   Resp 18   Ht 5\' 9"  (1.753 m)   Wt 108 kg   SpO2 97%   BMI 35.15 kg/m   Visual Acuity Right Eye Distance:   Left Eye Distance:   Bilateral Distance:    Right Eye Near:   Left Eye Near:    Bilateral Near:     Physical Exam Nursing notes and Vital Signs reviewed. Appearance:  Patient appears stated age, and in no acute distress Eyes:  Pupils are equal, round, and reactive to light and accomodation.  Extraocular movement is intact.   Conjunctivae are not inflamed  Ears:  Canals normal.  Tympanic membranes normal.  Nose:  Mildly congested turbinates.  No sinus tenderness.  Pharynx:  Normal Neck:  Supple.  Mildly enlarged lateral nodes are present, tender to palpation on the left.   Lungs:  Clear to auscultation.  Breath sounds are equal.  Moving air well. Chest:  Distinct tenderness to palpation over the mid-sternum.  Palpation there recreates her anterior chest pain. Heart:  Regular rate and rhythm without murmurs, rubs, or gallops.  Abdomen:  Nontender without masses or hepatosplenomegaly.  Bowel sounds are present.  No CVA or flank tenderness.  Extremities:  No edema.  Skin:  No rash present.   UC Treatments / Results  Labs (all labs ordered are listed, but only abnormal results are displayed) Labs Reviewed - No data to display  EKG   Radiology No results found.  Procedures Procedures (including critical care time)  Medications Ordered in UC Medications - No data to display  Initial Impression / Assessment and Plan / UC Course  I have reviewed the triage vital signs and the nursing notes.  Pertinent labs & imaging results that were available during my care of the patient were reviewed by me and considered in my medical decision making (see chart for details).    There is no evidence of bacterial infection today.  Treat symptomatically for now  Given Rx to hold for Z-pack. Followup with Family Doctor if not improved in about 10 days.  Final Clinical Impressions(s) / UC Diagnoses   Final diagnoses:  Viral URI with cough  Costochondritis     Discharge Instructions      Apply ice pack to sternum for 20 to 30 minutes, 3 to 4 times daily  Continue until pain decreases.   Take plain guaifenesin (1200mg  extended release tabs such as Mucinex) twice daily, with plenty of water, for cough and congestion.  Get adequate rest.   May use Afrin nasal spray (or generic oxymetazoline) each morning for about 5  days and then discontinue.  Also recommend using saline nasal spray several times daily and saline nasal irrigation (AYR is a common brand).  Use Flonase nasal spray each morning after using Afrin nasal spray and saline nasal irrigation. May take Delsym Cough Suppressant ("12 Hour Cough Relief") at bedtime for nighttime cough.  Stop all antihistamines for now, and  other non-prescription cough/cold preparations. May take Ibuprofen 200mg , 4 tabs every 8 hours with food for chest/sternum discomfort. Begin Azithromycin if not improving about one week or if persistent fever develops (Given a prescription to hold, with an expiration date)        ED Prescriptions     Medication Sig Dispense Auth. Provider   azithromycin (ZITHROMAX Z-PAK) 250 MG tablet Take 2 tabs today; then begin one tab once daily for 4 more days. 6 tablet , MD         Lattie Haw, MD 01/22/21 1009

## 2021-01-20 NOTE — Discharge Instructions (Signed)
Apply ice pack to sternum for 20 to 30 minutes, 3 to 4 times daily  Continue until pain decreases.   Take plain guaifenesin (1200mg  extended release tabs such as Mucinex) twice daily, with plenty of water, for cough and congestion.  Get adequate rest.   May use Afrin nasal spray (or generic oxymetazoline) each morning for about 5 days and then discontinue.  Also recommend using saline nasal spray several times daily and saline nasal irrigation (AYR is a common brand).  Use Flonase nasal spray each morning after using Afrin nasal spray and saline nasal irrigation. May take Delsym Cough Suppressant ("12 Hour Cough Relief") at bedtime for nighttime cough.  Stop all antihistamines for now, and other non-prescription cough/cold preparations. May take Ibuprofen 200mg , 4 tabs every 8 hours with food for chest/sternum discomfort. Begin Azithromycin if not improving about one week or if persistent fever develops

## 2021-01-20 NOTE — ED Triage Notes (Addendum)
Congestion, dry cough, x 5 days. Unvaccinated, has had 2 Negative Covid tests this week

## 2021-01-30 NOTE — Progress Notes (Signed)
PCP:  Judy Pimple, MD Primary Cardiologist: Lorine Bears, MD Electrophysiologist: Lewayne Bunting, MD   Abigail Pham is a 25 y.o. female seen today for Lewayne Bunting, MD for routine electrophysiology followup.  Since last being seen in our clinic the patient reports doing very well. She has brief, hard palpitations 1-2 times monthly. No clear associated with menstruation. Avoids caffeine and no real ETOH use. Does vape. Symptoms last for 1-2 beats to a minute at longest.  she denies chest pain, dyspnea, PND, orthopnea, nausea, vomiting, dizziness, syncope, edema, weight gain, or early satiety.  Past Medical History:  Diagnosis Date   Heart murmur of newborn    History of chicken pox    around age 42   WPW (Wolff-Parkinson-White syndrome)    Hosp at [facility] at one month of age for Tachyarrrythmia and pulmonary hemorrhage   No past surgical history on file.  Current Outpatient Medications  Medication Sig Dispense Refill   Ascorbic Acid (VITAMIN C PO) Take by mouth daily.     IBUPROFEN PO Take by mouth as needed.     metoprolol succinate (TOPROL-XL) 25 MG 24 hr tablet Take 1.5 tablets (37.5 mg total) by mouth daily. 135 tablet 0   No current facility-administered medications for this visit.    Allergies  Allergen Reactions   Other Other (See Comments)    Antihistamines or other stimulants-heart races    Social History   Socioeconomic History   Marital status: Married    Spouse name: Not on file   Number of children: Not on file   Years of education: Not on file   Highest education level: Not on file  Occupational History   Not on file  Tobacco Use   Smoking status: Never    Passive exposure: Yes   Smokeless tobacco: Never   Tobacco comments:    pt smokes vapors occ with nicotine 3 mg   Vaping Use   Vaping Use: Every day   Substances: Nicotine, Flavoring  Substance and Sexual Activity   Alcohol use: Yes    Alcohol/week: 0.0 standard drinks    Comment: occ   Drug  use: No   Sexual activity: Yes    Birth control/protection: Pill  Other Topics Concern   Not on file  Social History Narrative   Not on file   Social Determinants of Health   Financial Resource Strain: Not on file  Food Insecurity: Not on file  Transportation Needs: Not on file  Physical Activity: Not on file  Stress: Not on file  Social Connections: Not on file  Intimate Partner Violence: Not on file     Review of Systems: All other systems reviewed and are otherwise negative except as noted above.  Physical Exam: Vitals:   01/31/21 1131  BP: 114/74  Pulse: 71  SpO2: 98%  Weight: 239 lb (108.4 kg)  Height: 5\' 9"  (1.753 m)    GEN- The patient is well appearing, alert and oriented x 3 today.   HEENT: normocephalic, atraumatic; sclera clear, conjunctiva pink; hearing intact; oropharynx clear; neck supple, no JVP Lymph- no cervical lymphadenopathy Lungs- Clear to ausculation bilaterally, normal work of breathing.  No wheezes, rales, rhonchi Heart- Regular rate and rhythm, no murmurs, rubs or gallops, PMI not laterally displaced GI- soft, non-tender, non-distended, bowel sounds present, no hepatosplenomegaly Extremities- no clubbing, cyanosis, or edema; DP/PT/radial pulses 2+ bilaterally MS- no significant deformity or atrophy Skin- warm and dry, no rash or lesion Psych- euthymic mood, full affect  Neuro- strength and sensation are intact  EKG is ordered. Personal review of EKG from today shows NSR at 71 bpm  Additional studies reviewed include: Previous EP office notes.   Assessment and Plan:  1. Diagnosis of WPW Has not been clearly seen by our team. Pt was "noted to have intermittent ventricular pre-excitation which was only noted during slow sinus rates and at times when her vagal tone was increased. She did not have SVT when she wore her cardiac monitor" Encouraged hydration and exercise.  Continue Toprol 37.5 mg daily for now.   2. Palpitations /  SVT Symptoms are well controlled on BB. Discussed with patient that an ablation would involve an EP study first to determine what arrhythmias were present, if they were thought to be causing her symptoms, and if they would be amenable to ablation. She is aware that success varies depending on the specific issue.  Continue watchful waiting  Follow up with Dr. Ladona Ridgel in 12 months   Graciella Freer, PA-C  01/31/21 11:44 AM

## 2021-01-31 ENCOUNTER — Encounter: Payer: Self-pay | Admitting: Student

## 2021-01-31 ENCOUNTER — Other Ambulatory Visit: Payer: Self-pay

## 2021-01-31 ENCOUNTER — Ambulatory Visit (INDEPENDENT_AMBULATORY_CARE_PROVIDER_SITE_OTHER): Payer: Self-pay | Admitting: Student

## 2021-01-31 VITALS — BP 114/74 | HR 71 | Ht 69.0 in | Wt 239.0 lb

## 2021-01-31 DIAGNOSIS — R002 Palpitations: Secondary | ICD-10-CM

## 2021-01-31 DIAGNOSIS — I456 Pre-excitation syndrome: Secondary | ICD-10-CM

## 2021-04-14 ENCOUNTER — Other Ambulatory Visit: Payer: Self-pay

## 2021-04-14 MED ORDER — METOPROLOL SUCCINATE ER 25 MG PO TB24: 37.5000 mg | ORAL_TABLET | Freq: Every day | ORAL | 3 refills | Status: DC

## 2021-07-10 ENCOUNTER — Encounter: Payer: Self-pay | Admitting: Internal Medicine

## 2021-07-31 ENCOUNTER — Encounter: Payer: Self-pay | Admitting: Family Medicine

## 2021-08-01 ENCOUNTER — Encounter: Payer: Self-pay | Admitting: Family Medicine

## 2021-08-01 NOTE — Telephone Encounter (Signed)
Printed and given to CMA

## 2021-08-03 ENCOUNTER — Ambulatory Visit: Payer: Self-pay | Admitting: Student

## 2021-08-03 NOTE — Progress Notes (Unsigned)
PCP:  Judy Pimple, MD Primary Cardiologist: Lorine Bears, MD Electrophysiologist: Lewayne Bunting, MD   Abigail Pham is a 26 y.o. female seen today for Lewayne Bunting, MD for acute visit due to increase in palpitations. .  Since last being seen in our clinic the patient reports doing ***.  she denies chest pain, palpitations, dyspnea, PND, orthopnea, nausea, vomiting, dizziness, syncope, edema, weight gain, or early satiety.  Past Medical History:  Diagnosis Date   Arrhythmia    Heart disease    Heart murmur of newborn    History of chicken pox    around age 6   WPW (Wolff-Parkinson-White syndrome)    Hosp at [facility] at one month of age for Tachyarrrythmia and pulmonary hemorrhage   No past surgical history on file.  Current Outpatient Medications  Medication Sig Dispense Refill   Ascorbic Acid (VITAMIN C PO) Take by mouth daily.     IBUPROFEN PO Take by mouth as needed.     metoprolol succinate (TOPROL-XL) 25 MG 24 hr tablet Take 1.5 tablets (37.5 mg total) by mouth daily. 135 tablet 3   No current facility-administered medications for this visit.    Allergies  Allergen Reactions   Other Other (See Comments)    Antihistamines or other stimulants-heart races    Social History   Socioeconomic History   Marital status: Married    Spouse name: Not on file   Number of children: Not on file   Years of education: Not on file   Highest education level: Not on file  Occupational History   Not on file  Tobacco Use   Smoking status: Never    Passive exposure: Yes   Smokeless tobacco: Never   Tobacco comments:    pt smokes vapors occ with nicotine 3 mg   Vaping Use   Vaping Use: Every day   Substances: Nicotine, Flavoring  Substance and Sexual Activity   Alcohol use: Yes    Alcohol/week: 0.0 standard drinks    Comment: occ   Drug use: No   Sexual activity: Yes    Partners: Male    Birth control/protection: Pill  Other Topics Concern   Not on file  Social History  Narrative   Not on file   Social Determinants of Health   Financial Resource Strain: Not on file  Food Insecurity: Not on file  Transportation Needs: Not on file  Physical Activity: Not on file  Stress: Not on file  Social Connections: Not on file  Intimate Partner Violence: Not on file     Review of Systems: All other systems reviewed and are otherwise negative except as noted above.  Physical Exam: There were no vitals filed for this visit.  GEN- The patient is well appearing, alert and oriented x 3 today.   HEENT: normocephalic, atraumatic; sclera clear, conjunctiva pink; hearing intact; oropharynx clear; neck supple, no JVP Lymph- no cervical lymphadenopathy Lungs- Clear to ausculation bilaterally, normal work of breathing.  No wheezes, rales, rhonchi Heart- Regular rate and rhythm, no murmurs, rubs or gallops, PMI not laterally displaced GI- soft, non-tender, non-distended, bowel sounds present, no hepatosplenomegaly Extremities- no clubbing, cyanosis, or edema; DP/PT/radial pulses 2+ bilaterally MS- no significant deformity or atrophy Skin- warm and dry, no rash or lesion Psych- euthymic mood, full affect Neuro- strength and sensation are intact  EKG is ordered. Personal review of EKG from today shows ***  Additional studies reviewed include: Previous EP office notes. ***  Assessment and Plan:  1. Diagnosis of WPW Has not been clearly seen by our team. Pt was "noted to have intermittent ventricular pre-excitation which was only noted during slow sinus rates and at times when her vagal tone was increased. She did not have SVT when she wore her cardiac monitor" Encouraged hydration and exercise.  *** Toprol 37.5 mg daily    2. Palpitations / SVT Symptoms ***  Discussed with patient that an ablation would involve an EP study first to determine what arrhythmias were present, if they were thought to be causing her symptoms, and if they would be amenable to ablation.  She is aware that success varies depending on the specific issue.  Continue watchful waiting  Follow up with {Blank single:19197::"Dr. Allred","Dr. Amada Jupiter. Klein","Dr. Camnitz","Dr. Lambert","EP APP"} in {Blank single:19197::"2 weeks","4 weeks","3 months","6 months","12 months","as usual post gen change"}   Graciella Freer, PA-C  08/03/21 8:06 AM

## 2021-08-09 ENCOUNTER — Ambulatory Visit (INDEPENDENT_AMBULATORY_CARE_PROVIDER_SITE_OTHER): Payer: 59 | Admitting: Family Medicine

## 2021-08-09 ENCOUNTER — Encounter: Payer: Self-pay | Admitting: Family Medicine

## 2021-08-09 VITALS — BP 105/65 | HR 74 | Temp 97.8°F | Ht 68.5 in | Wt 235.0 lb

## 2021-08-09 DIAGNOSIS — L920 Granuloma annulare: Secondary | ICD-10-CM | POA: Diagnosis not present

## 2021-08-09 DIAGNOSIS — Z7689 Persons encountering health services in other specified circumstances: Secondary | ICD-10-CM

## 2021-08-09 MED ORDER — FLUOCINONIDE 0.05 % EX CREA: 1.0000 "application " | TOPICAL_CREAM | Freq: Two times a day (BID) | CUTANEOUS | 0 refills | Status: DC

## 2021-08-09 NOTE — Progress Notes (Signed)
Patient ID: Abigail Pham, female  DOB: [birth year], 27 y.o.   MRN: [id number] Patient Care Team    Relationship Specialty Notifications Start End  Natalia Leatherwood, DO PCP - General Family Medicine  08/09/21   Iran Ouch, MD Consulting Physician Cardiology  12/12/18   Marinus Maw, MD Consulting Physician Cardiology  09/22/19     Chief Complaint  Patient presents with   Establish Care   Rash    Pt c/o rash on L ankle x 5 mos;     Subjective:  Abigail Pham is a 26 y.o.  female present for new patient establishment with this provider- prior [place] provider PCP All past medical history, surgical history, allergies, family history, immunizations, medications and social history were updated in the electronic medical record today. All recent labs, ED visits and hospitalizations within the last year were reviewed.  Rash: Pt reports she has a rash that appeared on her left lateral ankle about 5 months ago. She states the rash does not bother her in anyway. She has never had a rash similar in the past. Not itchy or dry. She works with [employer], medical.      08/09/2021    1:49 PM 05/22/2019    2:17 PM 03/26/2017    3:04 PM  Depression screen PHQ 2/9  Decreased Interest 0 0 2  Down, Depressed, Hopeless 0 0 1  PHQ - 2 Score 0 0 3  Altered sleeping   2  Tired, decreased energy   2  Change in appetite   1  Feeling bad or failure about yourself    0  Trouble concentrating   2  Moving slowly or fidgety/restless   2  Suicidal thoughts   0  PHQ-9 Score   12       View : No data to display.                05/22/2019    2:17 PM  Fall Risk   Falls in the past year? 0   Immunization History  Administered Date(s) Administered   HPV 9-valent 10/01/2018, 12/30/2018, 04/28/2019   Influenza,inj,Quad PF,6+ Mos 12/23/2012, 03/26/2017   Influenza-Unspecified 03/13/2009   Meningococcal Conjugate 10/20/2012   Tdap 03/13/2009    No results found.  Past Medical History:  Diagnosis  Date   Anxiety with panic    Dysmenorrhea    inproved on Junel Fe1/20   Heart disease    Heart murmur of newborn    History of chicken pox    around age 26   SVT (supraventricular tachycardia) (HCC)    WPW (Wolff-Parkinson-White syndrome)    declined ablation.Hosp at [facility] at one month of age for Tachyarrrythmia and pulmonary hemorrhage   Allergies  Allergen Reactions   Other Other (See Comments)    Antihistamines or other stimulants-heart races   History reviewed. No pertinent surgical history. Family History  Problem Relation Age of Onset   Cervical cancer Mother    Alcohol abuse Mother    Depression Mother    Drug abuse Mother    Mental illness Mother    Diabetes Father    Alcohol abuse Father    High blood pressure Father    COPD Father    Mental retardation Father    Heart disease Father    COPD Paternal Grandmother    Hypertension Paternal Grandfather    Alcohol abuse Paternal Grandfather    Stroke Paternal Grandfather    Hearing loss Paternal Actor  Social History   Social History Narrative   Marital status/children/pets: Married.  G0P0   Education/employment: High school graduate.   Safety:      -Wears a bicycle helmet riding a bike: Yes     -smoke alarm in the home:Yes     - wears seatbelt: Yes     - Feels safe in their relationships: Yes       Allergies as of 08/09/2021       Reactions   Other Other (See Comments)   Antihistamines or other stimulants-heart races        Medication List        Accurate as of Aug 09, 2021  2:11 PM. If you have any questions, ask your nurse or doctor.          albuterol 108 (90 Base) MCG/ACT inhaler Commonly known as: VENTOLIN HFA SMARTSIG:2 Puff(s) By Mouth Every 4 Hours PRN   fluocinonide cream 0.05 % Commonly known as: LIDEX Apply 1 application. topically 2 (two) times daily. Started by: Felix Pacinienee Murle Otting, DO   IBUPROFEN PO Take by mouth as needed.   metoprolol succinate 25 MG 24 hr  tablet Commonly known as: TOPROL-XL Take 1.5 tablets (37.5 mg total) by mouth daily. What changed: how much to take   VITAMIN C PO Take by mouth daily.        All past medical history, surgical history, allergies, family history, immunizations andmedications were updated in the EMR today and reviewed under the history and medication portions of their EMR.    No results found for this or any previous visit (from the past 2160 hour(s)).  No results found.  ROS 14 pt review of systems performed and negative (unless mentioned in an HPI)  Objective: BP 105/65   Pulse 74   Temp 97.8 F (36.6 C) (Oral)   Ht 5' 8.5" (1.74 m)   Wt 235 lb (106.6 kg)   LMP 07/28/2021   SpO2 96%   BMI 35.21 kg/m  Physical Exam Vitals and nursing note reviewed.  Constitutional:      General: She is not in acute distress.    Appearance: Normal appearance. She is normal weight. She is not ill-appearing or toxic-appearing.  Eyes:     Extraocular Movements: Extraocular movements intact.     Conjunctiva/sclera: Conjunctivae normal.     Pupils: Pupils are equal, round, and reactive to light.  Cardiovascular:     Rate and Rhythm: Normal rate and regular rhythm.  Musculoskeletal:     Right lower leg: No edema.     Left lower leg: No edema.  Skin:    Findings: Rash (annular raised lesion with central sparing. additional small red raised area distally.) present.  Neurological:     Mental Status: She is alert and oriented to person, place, and time. Mental status is at baseline.  Psychiatric:        Mood and Affect: Mood normal.        Behavior: Behavior normal.        Thought Content: Thought content normal.        Judgment: Judgment normal.       Assessment/plan: Abigail StainKalie L Pham is a 26 y.o. 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  Discussed potential causes of condition. F/u 3-4 weeks for recheck and cpe  Return in about 4 weeks (around  09/06/2021) for cpe (40 min) if pap.  No orders of the defined types were placed in  this encounter.  Meds ordered this encounter  Medications   fluocinonide cream (LIDEX) 0.05 %    Sig: Apply 1 application. topically 2 (two) times daily.    Dispense:  60 g    Refill:  0   Referral Orders  No referral(s) requested today     Note is dictated utilizing voice recognition software. Although note has been proof read prior to signing, occasional typographical errors still can be missed. If any questions arise, please do not hesitate to call for verification.  Electronically signed by: Felix Pacini, DO [facility] [place]

## 2021-08-09 NOTE — Patient Instructions (Addendum)
Return for cpe (40 min) if pap.        Great to see you today.  I have refilled the medication(s) we provide.   If labs were collected, we will inform you of lab results once received either by echart message or telephone call.   - echart message- for normal results that have been seen by the patient already.   - telephone call: abnormal results or if patient has not viewed results in their echart.

## 2021-09-07 ENCOUNTER — Encounter: Payer: Self-pay | Admitting: Family Medicine

## 2021-09-07 ENCOUNTER — Ambulatory Visit (INDEPENDENT_AMBULATORY_CARE_PROVIDER_SITE_OTHER): Payer: 59 | Admitting: Family Medicine

## 2021-09-07 DIAGNOSIS — Z79899 Other long term (current) drug therapy: Secondary | ICD-10-CM

## 2021-09-07 DIAGNOSIS — I456 Pre-excitation syndrome: Secondary | ICD-10-CM

## 2021-09-07 DIAGNOSIS — E6609 Other obesity due to excess calories: Secondary | ICD-10-CM | POA: Diagnosis not present

## 2021-09-07 DIAGNOSIS — Z23 Encounter for immunization: Secondary | ICD-10-CM | POA: Diagnosis not present

## 2021-09-07 DIAGNOSIS — Z7689 Persons encountering health services in other specified circumstances: Secondary | ICD-10-CM

## 2021-09-07 DIAGNOSIS — Z0000 Encounter for general adult medical examination without abnormal findings: Secondary | ICD-10-CM | POA: Diagnosis not present

## 2021-09-07 DIAGNOSIS — E66812 Obesity, class 2: Secondary | ICD-10-CM

## 2021-09-07 DIAGNOSIS — Z6835 Body mass index (BMI) 35.0-35.9, adult: Secondary | ICD-10-CM

## 2021-09-07 DIAGNOSIS — Z1159 Encounter for screening for other viral diseases: Secondary | ICD-10-CM | POA: Diagnosis not present

## 2021-09-07 DIAGNOSIS — G8929 Other chronic pain: Secondary | ICD-10-CM

## 2021-09-07 DIAGNOSIS — M25512 Pain in left shoulder: Secondary | ICD-10-CM

## 2021-09-07 LAB — COMPREHENSIVE METABOLIC PANEL
ALT: 14 U/L (ref 0–35)
AST: 13 U/L (ref 0–37)
Albumin: 4.3 g/dL (ref 3.5–5.2)
Alkaline Phosphatase: 54 U/L (ref 39–117)
BUN: 10 mg/dL (ref 6–23)
CO2: 25 mEq/L (ref 19–32)
Calcium: 9.4 mg/dL (ref 8.4–10.5)
Chloride: 104 mEq/L (ref 96–112)
Creatinine, Ser: 0.86 mg/dL (ref 0.40–1.20)
GFR: 92.83 mL/min (ref >60.00)
Glucose, Bld: 93 mg/dL (ref 70–99)
Potassium: 4 mEq/L (ref 3.5–5.1)
Sodium: 138 mEq/L (ref 135–145)
Total Bilirubin: 0.4 mg/dL (ref 0.2–1.2)
Total Protein: 6.7 g/dL (ref 6.0–8.3)

## 2021-09-07 LAB — LIPID PANEL
Cholesterol: 134 mg/dL (ref 0–200)
HDL: 43.9 mg/dL (ref >39.00)
LDL Cholesterol: 64 mg/dL (ref 0–99)
NonHDL: 89.83
Total CHOL/HDL Ratio: 3
Triglycerides: 128 mg/dL (ref 0.0–149.0)
VLDL: 25.6 mg/dL (ref 0.0–40.0)

## 2021-09-07 LAB — CBC
HCT: 42.1 % (ref 36.0–46.0)
Hemoglobin: 14.1 g/dL (ref 12.0–15.0)
MCHC: 33.5 g/dL (ref 30.0–36.0)
MCV: 89 fl (ref 78.0–100.0)
Platelets: 240 10*3/uL (ref 150.0–400.0)
RBC: 4.73 Mil/uL (ref 3.87–5.11)
RDW: 12.5 % (ref 11.5–15.5)
WBC: 9.4 10*3/uL (ref 4.0–10.5)

## 2021-09-07 LAB — HEMOGLOBIN A1C: Hgb A1c MFr Bld: 5.4 % (ref 4.6–6.5)

## 2021-09-07 LAB — TSH: TSH: 5.76 u[IU]/mL — ABNORMAL HIGH (ref 0.35–5.50)

## 2021-09-07 MED ORDER — FLUOCINONIDE 0.05 % EX CREA: 1.0000 | TOPICAL_CREAM | Freq: Two times a day (BID) | CUTANEOUS | 0 refills | Status: DC

## 2021-09-07 NOTE — Patient Instructions (Addendum)
?Great to see you today.  ?I have refilled the medication(s) we provide.  ? ?If labs were collected, we will inform you of lab results once received either by echart message or telephone call.  ? - echart message- for normal results that have been seen by the patient already.  ? - telephone call: abnormal results or if patient has not viewed results in their echart. ? ?Health Maintenance, Female ?Adopting a healthy lifestyle and getting preventive care are important in promoting health and wellness. Ask your health care provider about: ?The right schedule for you to have regular tests and exams. ?Things you can do on your own to prevent diseases and keep yourself healthy. ?What should I know about diet, weight, and exercise? ?Eat a healthy diet ? ?Eat a diet that includes plenty of vegetables, fruits, low-fat dairy products, and lean protein. ?Do not eat a lot of foods that are high in solid fats, added sugars, or sodium. ?Maintain a healthy weight ?Body mass index (BMI) is used to identify weight problems. It estimates body fat based on height and weight. Your health care provider can help determine your BMI and help you achieve or maintain a healthy weight. ?Get regular exercise ?Get regular exercise. This is one of the most important things you can do for your health. Most adults should: ?Exercise for at least 150 minutes each week. The exercise should increase your heart rate and make you sweat (moderate-intensity exercise). ?Do strengthening exercises at least twice a week. This is in addition to the moderate-intensity exercise. ?Spend less time sitting. Even light physical activity can be beneficial. ?Watch cholesterol and blood lipids ?Have your blood tested for lipids and cholesterol at 27 years of age, then have this test every 5 years. ?Have your cholesterol levels checked more often if: ?Your lipid or cholesterol levels are high. ?You are older than 27 years of age. ?You are at high risk for heart  disease. ?What should I know about cancer screening? ?Depending on your health history and family history, you may need to have cancer screening at various ages. This may include screening for: ?Breast cancer. ?Cervical cancer. ?Colorectal cancer. ?Skin cancer. ?Lung cancer. ?What should I know about heart disease, diabetes, and high blood pressure? ?Blood pressure and heart disease ?High blood pressure causes heart disease and increases the risk of stroke. This is more likely to develop in people who have high blood pressure readings or are overweight. ?Have your blood pressure checked: ?Every 3-5 years if you are 18-39 years of age. ?Every year if you are 40 years old or older. ?Diabetes ?Have regular diabetes screenings. This checks your fasting blood sugar level. Have the screening done: ?Once every three years after age 40 if you are at a normal weight and have a low risk for diabetes. ?More often and at a younger age if you are overweight or have a high risk for diabetes. ?What should I know about preventing infection? ?Hepatitis B ?If you have a higher risk for hepatitis B, you should be screened for this virus. Talk with your health care provider to find out if you are at risk for hepatitis B infection. ?Hepatitis C ?Testing is recommended for: ?Everyone born from 1945 through 1965. ?Anyone with known risk factors for hepatitis C. ?Sexually transmitted infections (STIs) ?Get screened for STIs, including gonorrhea and chlamydia, if: ?You are sexually active and are younger than 27 years of age. ?You are older than 27 years of age and your health care provider   tells you that you are at risk for this type of infection. ?Your sexual activity has changed since you were last screened, and you are at increased risk for chlamydia or gonorrhea. Ask your health care provider if you are at risk. ?Ask your health care provider about whether you are at high risk for HIV. Your health care provider may recommend a  prescription medicine to help prevent HIV infection. If you choose to take medicine to prevent HIV, you should first get tested for HIV. You should then be tested every 3 months for as long as you are taking the medicine. ?Pregnancy ?If you are about to stop having your period (premenopausal) and you may become pregnant, seek counseling before you get pregnant. ?Take 400 to 800 micrograms (mcg) of folic acid every day if you become pregnant. ?Ask for birth control (contraception) if you want to prevent pregnancy. ?Osteoporosis and menopause ?Osteoporosis is a disease in which the bones lose minerals and strength with aging. This can result in bone fractures. If you are 65 years old or older, or if you are at risk for osteoporosis and fractures, ask your health care provider if you should: ?Be screened for bone loss. ?Take a calcium or vitamin D supplement to lower your risk of fractures. ?Be given hormone replacement therapy (HRT) to treat symptoms of menopause. ?Follow these instructions at home: ?Alcohol use ?Do not drink alcohol if: ?Your health care provider tells you not to drink. ?You are pregnant, may be pregnant, or are planning to become pregnant. ?If you drink alcohol: ?Limit how much you have to: ?0-1 drink a day. ?Know how much alcohol is in your drink. In the [place] one drink equals one 12 oz bottle of beer (355 mL), one 5 oz glass of wine (148 mL), or one 1? oz glass of hard liquor (44 mL). ?Lifestyle ?Do not use any products that contain nicotine or tobacco. These products include cigarettes, chewing tobacco, and vaping devices, such as e-cigarettes. If you need help quitting, ask your health care provider. ?Do not use street drugs. ?Do not share needles. ?Ask your health care provider for help if you need support or information about quitting drugs. ?General instructions ?Schedule regular health, dental, and eye exams. ?Stay current with your vaccines. ?Tell your health care provider if: ?You often  feel depressed. ?You have ever been abused or do not feel safe at home. ?Summary ?Adopting a healthy lifestyle and getting preventive care are important in promoting health and wellness. ?Follow your health care provider's instructions about healthy diet, exercising, and getting tested or screened for diseases. ?Follow your health care provider's instructions on monitoring your cholesterol and blood pressure. ?This information is not intended to replace advice given to you by your health care provider. Make sure you discuss any questions you have with your health care provider. ?Document Revised: 07/18/2020 Document Reviewed: 07/18/2020 ?Elsevier Patient Education ? 2023 Elsevier Inc. ? ?

## 2021-09-07 NOTE — Progress Notes (Signed)
Patient ID: Abigail Pham, female  DOB: [birth year], 26 y.o.   MRN: [id number] Patient Care Team    Relationship Specialty Notifications Start End  Natalia Leatherwood, DO PCP - General Family Medicine  08/09/21   Iran Ouch, MD Consulting Physician Cardiology  12/12/18   Marinus Maw, MD Consulting Physician Cardiology  09/22/19     Chief Complaint  Patient presents with   Annual Exam    Pt is fasting    Subjective: Abigail Pham is a 26 y.o.  Female  present for CPE  All past medical history, surgical history, allergies, family history, immunizations, medications and social history were updated in the electronic medical record today. All recent labs, ED visits and hospitalizations within the last year were reviewed.  Health maintenance:  Colonoscopy: no family history.  Routine screening at 44 Mammogram: No family history routine screening at 81 Cervical cancer screening: last pap: January 2019.  Referral placed to gynecology today. Immunizations: tdap administered today, Influenza (encouraged yearly), covid declined Infectious disease screening: HIV declined, Hep C completed today DEXA: routine screen Assistive device: none Oxygen DEY:CXKG Patient has a Dental home. Hospitalizations/ED visits: reviewed  Left shoulder pain: Patient reports she has had chronic left shoulder pain intermittently for about 1 year.  She reports there was no injury that she was aware of.  She states it felt like her shoulder "locked "and was very painful, she could not move her shoulder at the time.  It lasted for a few minutes and then released to causing discomfort.  She states when this happened she cannot raise her arm and it has started to occur more frequently.     09/07/2021    9:51 AM 08/09/2021    1:49 PM 05/22/2019    2:17 PM 03/26/2017    3:04 PM  Depression screen PHQ 2/9  Decreased Interest 0 0 0 2  Down, Depressed, Hopeless 0 0 0 1  PHQ - 2 Score 0 0 0 3  Altered sleeping    2   Tired, decreased energy    2  Change in appetite    1  Feeling bad or failure about yourself     0  Trouble concentrating    2  Moving slowly or fidgety/restless    2  Suicidal thoughts    0  PHQ-9 Score    12       No data to display          Immunization History  Administered Date(s) Administered   HPV 9-valent 10/01/2018, 12/30/2018, 04/28/2019   Influenza,inj,Quad PF,6+ Mos 12/23/2012, 03/26/2017   Influenza-Unspecified 03/13/2009   Meningococcal Conjugate 10/20/2012   Tdap 03/13/2009, 09/07/2021     Past Medical History:  Diagnosis Date   Anxiety with panic    Dysmenorrhea    inproved on Junel Fe1/20   Heart disease    Heart murmur of newborn    History of ADHD 11/06/2013   History of chicken pox    around age 69   SVT (supraventricular tachycardia) (HCC)    WPW (Wolff-Parkinson-White syndrome)    declined ablation.[facility] at [facility] of age for Tachyarrrythmia and pulmonary hemorrhage   Allergies  Allergen Reactions   Other Other (See Comments)    Antihistamines or other stimulants-heart races   History reviewed. No pertinent surgical history. Family History  Problem Relation Age of Onset   Cervical cancer Mother    Alcohol abuse Mother    Depression Mother  Drug abuse Mother    Mental illness Mother    Diabetes Father    Alcohol abuse Father    High blood pressure Father    COPD Father    Mental retardation Father    Heart disease Father    COPD Paternal Grandmother    Hypertension Paternal Grandfather    Alcohol abuse Paternal Grandfather    Stroke Paternal Grandfather    Hearing loss Paternal Grandfather    Social History   Social History Narrative   Marital status/children/pets: Married.  G0P0   Education/employment: High school graduate.   Safety:      -Wears a bicycle helmet riding a bike: Yes     -smoke alarm in the home:Yes     - wears seatbelt: Yes     - Feels safe in their relationships: Yes       Allergies as of  09/07/2021       Reactions   Other Other (See Comments)   Antihistamines or other stimulants-heart races        Medication List        Accurate as of September 07, 2021 12:42 PM. If you have any questions, ask your nurse or doctor.          STOP taking these medications    albuterol 108 (90 Base) MCG/ACT inhaler Commonly known as: VENTOLIN HFA Stopped by: Felix Pacini, DO   IBUPROFEN PO Stopped by: Felix Pacini, DO       TAKE these medications    fluocinonide cream 0.05 % Commonly known as: LIDEX Apply 1 Application topically 2 (two) times daily. Hold until pt request please.thanks What changed: additional instructions Changed by: Felix Pacini, DO   metoprolol succinate 25 MG 24 hr tablet Commonly known as: TOPROL-XL Take 1.5 tablets (37.5 mg total) by mouth daily. What changed: how much to take   VITAMIN C PO Take by mouth daily.        All past medical history, surgical history, allergies, family history, immunizations andmedications were updated in the EMR today and reviewed under the history and medication portions of their EMR.     No results found for this or any previous visit (from the past 2160 hour(s)).  No results found.   ROS 14 pt review of systems performed and negative (unless mentioned in an HPI)  Objective: BP 97/66   Pulse 71   Temp 97.9 F (36.6 C) (Oral)   Ht 5' 8.5" (1.74 m)   Wt 235 lb (106.6 kg)   LMP 08/17/2021   SpO2 96%   BMI 35.21 kg/m  Physical Exam Vitals and nursing note reviewed.  Constitutional:      General: She is not in acute distress.    Appearance: Normal appearance. She is not ill-appearing or toxic-appearing.  HENT:     Head: Normocephalic and atraumatic.     Right Ear: Tympanic membrane, ear canal and external ear normal. There is no impacted cerumen.     Left Ear: Tympanic membrane, ear canal and external ear normal. There is no impacted cerumen.     Nose: No congestion or rhinorrhea.      Mouth/Throat:     Mouth: Mucous membranes are moist.     Pharynx: Oropharynx is clear. No oropharyngeal exudate or posterior oropharyngeal erythema.  Eyes:     General: No scleral icterus.       Right eye: No discharge.        Left eye: No discharge.  Extraocular Movements: Extraocular movements intact.     Conjunctiva/sclera: Conjunctivae normal.     Pupils: Pupils are equal, round, and reactive to light.  Cardiovascular:     Rate and Rhythm: Normal rate and regular rhythm.     Pulses: Normal pulses.     Heart sounds: Normal heart sounds. No murmur heard.    No friction rub. No gallop.  Pulmonary:     Effort: Pulmonary effort is normal. No respiratory distress.     Breath sounds: Normal breath sounds. No stridor. No wheezing, rhonchi or rales.  Chest:     Chest wall: No tenderness.  Abdominal:     General: Abdomen is flat. Bowel sounds are normal. There is no distension.     Palpations: Abdomen is soft. There is no mass.     Tenderness: There is no abdominal tenderness. There is no right CVA tenderness, left CVA tenderness, guarding or rebound.     Hernia: No hernia is present.  Musculoskeletal:        General: No swelling, tenderness or deformity. Normal range of motion.     Cervical back: Normal range of motion and neck supple. No rigidity or tenderness.     Right lower leg: No edema.     Left lower leg: No edema.  Lymphadenopathy:     Cervical: No cervical adenopathy.  Skin:    General: Skin is warm and dry.     Coloration: Skin is not jaundiced or pale.     Findings: No bruising, erythema, lesion or rash.  Neurological:     General: No focal deficit present.     Mental Status: She is alert and oriented to person, place, and time. Mental status is at baseline.     Cranial Nerves: No cranial nerve deficit.     Sensory: No sensory deficit.     Motor: No weakness.     Coordination: Coordination normal.     Gait: Gait normal.     Deep Tendon Reflexes: Reflexes normal.   Psychiatric:        Mood and Affect: Mood normal.        Behavior: Behavior normal.        Thought Content: Thought content normal.        Judgment: Judgment normal.     No results found.  Assessment/plan: JAHNAE MCADOO is a 26 y.o. female present for CPE  WPW/Class 2 obesity due to excess calories without serious comorbidity with body mass index (BMI) of 35.0 to 35.9 in adult/long term med use Continue follow-ups with cardiology which manages her metoprolol medication - CBC - Comprehensive metabolic panel - Hemoglobin A1c - Lipid panel - TSH Need for Tdap vaccination - Tdap vaccine greater than or equal to 7yo IM Encounter for hepatitis C screening test for low risk patient - Hepatitis C Antibody Chronic left shoulder pain Believe she would benefit from sports med referral since this is a chronic issue for her.  She is agreeable and I placed that referral for her today. - Ambulatory referral to Sports Medicine Encounter to establish care She is due for a Pap.  Would like to establish with gynecology. - Ambulatory referral to Gynecology Encounter for routine history and physical examination Colonoscopy: no family history.  Routine screening at 44 Mammogram: No family history routine screening at 79 Cervical cancer screening: last pap: January 2019.  Referral placed to gynecology today. Immunizations: tdap administered today, Influenza (encouraged yearly), covid declined Infectious disease screening: HIV declined, Hep C  completed today DEXA: routine screen Patient was encouraged to exercise greater than 150 minutes a week. Patient was encouraged to choose a diet filled with fresh fruits and vegetables, and lean meats. AVS provided to patient today for education/recommendation on gender specific health and safety maintenance.  Return in about 1 year (around 09/09/2022) for cpe (20 min).    Orders Placed This Encounter  Procedures   Tdap vaccine greater than or equal to 7yo  IM   CBC   Comprehensive metabolic panel   Hemoglobin A1c   Lipid panel   TSH   Hepatitis C Antibody   Ambulatory referral to Sports Medicine   Ambulatory referral to Gynecology   Meds ordered this encounter  Medications   fluocinonide cream (LIDEX) 0.05 %    Sig: Apply 1 Application topically 2 (two) times daily. Hold until pt request please.thanks    Dispense:  60 g    Refill:  0   Referral Orders         Ambulatory referral to Sports Medicine         Ambulatory referral to Gynecology       Electronically signed by: Felix Pacini, DO [facility] [place]

## 2021-09-08 LAB — HEPATITIS C ANTIBODY: Hepatitis C Ab: NONREACTIVE

## 2021-09-13 ENCOUNTER — Telehealth: Payer: Self-pay | Admitting: Family Medicine

## 2021-09-13 DIAGNOSIS — R7989 Other specified abnormal findings of blood chemistry: Secondary | ICD-10-CM

## 2021-09-13 NOTE — Telephone Encounter (Signed)
Spoke with pt regarding labs and instructions.   

## 2021-09-13 NOTE — Telephone Encounter (Signed)
Please call patient Liver and  kidney function are normal Blood cell counts and electrolytes are normal Diabetes screening/A1c is normal  Cholesterol panel looks great and is at goal for her.  Hep c screen is normal.    Thyroid levels are abnormal. We will need confirm result in 2 weeks by lab appt only. If still abnormal, she would need to start a thyroid medication. Please schedule this for her

## 2021-09-18 ENCOUNTER — Ambulatory Visit: Payer: 59 | Admitting: Family Medicine

## 2021-09-27 ENCOUNTER — Encounter: Payer: Self-pay | Admitting: Family Medicine

## 2021-09-27 ENCOUNTER — Ambulatory Visit (HOSPITAL_BASED_OUTPATIENT_CLINIC_OR_DEPARTMENT_OTHER)
Admission: RE | Admit: 2021-09-27 | Discharge: 2021-09-27 | Disposition: A | Discharge status: Home / Self Care | Payer: 59 | Source: Ambulatory Visit | Attending: Family Medicine | Admitting: Family Medicine

## 2021-09-27 ENCOUNTER — Ambulatory Visit (INDEPENDENT_AMBULATORY_CARE_PROVIDER_SITE_OTHER): Payer: 59 | Admitting: Family Medicine

## 2021-09-27 ENCOUNTER — Ambulatory Visit: Payer: Self-pay

## 2021-09-27 VITALS — BP 108/82 | Ht 68.5 in | Wt 235.0 lb

## 2021-09-27 DIAGNOSIS — S43001A Unspecified subluxation of right shoulder joint, initial encounter: Secondary | ICD-10-CM | POA: Insufficient documentation

## 2021-09-27 DIAGNOSIS — M357 Hypermobility syndrome: Secondary | ICD-10-CM | POA: Diagnosis not present

## 2021-09-27 DIAGNOSIS — M25812 Other specified joint disorders, left shoulder: Secondary | ICD-10-CM | POA: Insufficient documentation

## 2021-09-27 DIAGNOSIS — M25511 Pain in right shoulder: Secondary | ICD-10-CM

## 2021-09-27 MED ORDER — MELOXICAM 15 MG PO TABS: 15.0000 mg | ORAL_TABLET | Freq: Every day | ORAL | 1 refills | Status: DC | PRN

## 2021-09-27 NOTE — Patient Instructions (Signed)
Nice to meet you Please use ice as needed  Please consider compression for the left  Please try the exercise  I have made a referral to physical therapy  I will call with the xray results.   Please send me a message in MyChart with any questions or updates.  Please see me back in 4-6 weeks.   --Dr. Jordan Likes

## 2021-09-27 NOTE — Assessment & Plan Note (Signed)
Beighton score 8/9.  Her symptoms in her shoulder seem most consistent with hyper mobility.  Scans have been normal. -Referral to physical therapy.

## 2021-09-27 NOTE — Assessment & Plan Note (Addendum)
Acute on chronic in nature.  Symptoms initially occurred a year ago.  She does have episodes of feeling locked in a certain position.  No sign of effusion on exam today.  Likely ligament is getting trapped within the space causing these stuck feelings. -Counseled on home exercise therapy and supportive care. -Referral to physical therapy. -Counseled on compression. - mobic -Could consider further imaging.

## 2021-09-27 NOTE — Assessment & Plan Note (Signed)
Acutely occurring.  Symptoms seem more consistent with a subluxation with the mechanism was injured. -Counseled on home exercise therapy and supportive care. -X-ray. -Referral to physical therapy. -Could consider further imaging.
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
## 2021-09-28 ENCOUNTER — Ambulatory Visit (INDEPENDENT_AMBULATORY_CARE_PROVIDER_SITE_OTHER): Payer: 59

## 2021-09-28 ENCOUNTER — Telehealth: Payer: Self-pay | Admitting: Family Medicine

## 2021-09-28 DIAGNOSIS — R7989 Other specified abnormal findings of blood chemistry: Secondary | ICD-10-CM | POA: Diagnosis not present

## 2021-09-28 NOTE — Telephone Encounter (Signed)
Left VM for patient. If she calls back please have him speak with a nurse/CMA and inform that her xrays are normal. This can be the case with hypermobility. We will continue the plan.   If any questions then please take the best time and phone number to call and I will try to call her back.   Myra Rude, MD [facility] 09/28/2021, 8:19 AM

## 2021-09-29 LAB — TSH: TSH: 1.69 mIU/L

## 2021-09-29 LAB — T4, FREE: Free T4: 1.2 ng/dL (ref 0.8–1.8)

## 2021-10-11 ENCOUNTER — Ambulatory Visit: Payer: 59 | Attending: Family Medicine | Admitting: Rehabilitative and Restorative Service Providers"

## 2021-10-11 ENCOUNTER — Other Ambulatory Visit: Payer: Self-pay

## 2021-10-11 ENCOUNTER — Encounter: Payer: Self-pay | Admitting: Rehabilitative and Restorative Service Providers"

## 2021-10-11 DIAGNOSIS — M25812 Other specified joint disorders, left shoulder: Secondary | ICD-10-CM | POA: Insufficient documentation

## 2021-10-11 DIAGNOSIS — S43001A Unspecified subluxation of right shoulder joint, initial encounter: Secondary | ICD-10-CM | POA: Diagnosis not present

## 2021-10-11 DIAGNOSIS — M25512 Pain in left shoulder: Secondary | ICD-10-CM | POA: Diagnosis present

## 2021-10-11 DIAGNOSIS — R293 Abnormal posture: Secondary | ICD-10-CM | POA: Diagnosis present

## 2021-10-11 DIAGNOSIS — M357 Hypermobility syndrome: Secondary | ICD-10-CM | POA: Insufficient documentation

## 2021-10-11 DIAGNOSIS — R29898 Other symptoms and signs involving the musculoskeletal system: Secondary | ICD-10-CM | POA: Diagnosis present

## 2021-10-11 NOTE — Therapy (Signed)
OUTPATIENT PHYSICAL THERAPY SHOULDER EVALUATION   Patient Name: Abigail Pham MRN: [id number] DOB:1995-05-19, 26 y.o., female Today's Date: 10/11/2021    Past Medical History:  Diagnosis Date   Anxiety with panic    Dysmenorrhea    inproved on Junel Fe1/20   Heart disease    Heart murmur of newborn    History of ADHD 11/06/2013   History of chicken pox    around age 60   SVT (supraventricular tachycardia) (HCC)    WPW (Wolff-Parkinson-White syndrome)    declined ablation.Hosp at [facility] at one month of age for Tachyarrrythmia and pulmonary hemorrhage   No past surgical history on file. Patient Active Problem List   Diagnosis Date Noted   Hypermobility syndrome 09/27/2021   Subluxation of right shoulder joint 09/27/2021   Impingement of left shoulder 09/27/2021   Encounter for long-term current use of medication 09/07/2021   Oral contraceptive use 07/07/2018   Anxiety-like symptoms 03/26/2017   Poor concentration 11/06/2013   WPW (Wolff-Parkinson-White syndrome) 11/09/2010    PCP: Dr Felix Pacini  REFERRING PROVIDER: Dr Clare Gandy  REFERRING DIAG: Impingement Lt shoulder   THERAPY DIAG:  No diagnosis found.  Rationale for Evaluation and Treatment Rehabilitation  ONSET DATE: 09/09/20  SUBJECTIVE:                                                                                                                                                                                      SUBJECTIVE STATEMENT: Patient reports that she has had Lt shoulder pain and catching for he past year. Last month she started having in the pain in the Rt shoulder. She has no known accident or injury.   PERTINENT HISTORY: SVT(tachycardia - controlled with meds); Lt shoulder pain and catching x 1 year   PAIN:  Are you having pain? Yes: NPRS scale: 0/10 Pain location: Lt or Rt shoulder Pain description: tension, stiffness, sharp when shoulder gets "stuck" Aggravating factors: unknown -  sometimes with sudden movement on neck and arm, sudden movements of shoulders or turning body Relieving factors: nothing  PRECAUTIONS: None  WEIGHT BEARING RESTRICTIONS No  FALLS:  Has patient fallen in last 6 months? 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 Household chores; 4 wheeler; at [employer]; dogs; fishing   PLOF: Independent  PATIENT GOALS get rid of the shoulder pain and shoulders getting stuck  OBJECTIVE:   DIAGNOSTIC FINDINGS:  Lt shoulder: Negative radiographs  of the left shoulder. Rt shoulder: There is no evidence of fracture or dislocation. There is noevidence of arthropathy or other focal bone abnormality. Soft tissues are unremarkable. PATIENT SURVEYS:  FOTO 55  COGNITION:  Overall cognitive status: Within functional limits for tasks assessed     SENSATION: WFL per pt report   POSTURE: Head forward; shoulders rounded and elevated; head of the humerus anterior in orientation, increased thoracic kyphosis, scapulae abducted and rotated along the thoracic wall.   UPPER EXTREMITY ROM:   Active ROM Right eval Left eval  Shoulder flexion 145 157  Shoulder extension 45 46  Shoulder abduction 157 160      Shoulder internal rotation Thumb T6 Thumb T5  Shoulder external rotation 90 90                                  (Blank rows = not tested)  UPPER EXTREMITY MMT:  MMT Right eval Left eval  Shoulder flexion 5/5 5/5  Shoulder extension 5/5 5/5  Shoulder abduction 5/5 5/5      Shoulder internal rotation 5/5 5/5  Shoulder external rotation 5/5 5/5  Middle trapezius 4+/5 4+/5  Lower trapezius 4+/5 4+/5                                      (Blank rows = not tested)     PALPATION:  Muscular  tightness Lt > Rt pecs, upper trap, leveator, biceps, teres   TODAY'S TREATMENT:  Postural education and correction Ther ex: Chin tuck 10 sec x 5 Scap squeeze 10 sec x 5 L's x 10 W's x 10  Shoulder ER in neutral red TB 3 sec x 10 reps  Shoulder ER in 45 deg abd red TB x 3 sec hold x 10 reps  Doorway stretch 3 positions 30 sec hold x 2 reps      PATIENT EDUCATION: Education details: HEP; POC  Person educated: Patient Education method: Explanation, Demonstration, Tactile cues, Verbal cues, and Handouts Education comprehension: verbalized understanding, returned demonstration, verbal cues required, tactile cues required, and needs further education   HOME EXERCISE PROGRAM: Access Code: [email or web address] URL: [email or web address] Date: 10/11/2021 Prepared by: Corlis Leak  Exercises - Seated Cervical Retraction  - 3 x daily - 7 x weekly - 1 sets - 10 reps - Standing Scapular Retraction  - 3 x daily - 7 x weekly - 1 sets - 10 reps - 10 hold - Shoulder External Rotation and Scapular Retraction  - 3 x daily - 7 x weekly - 1 sets - 10 reps -   hold - Shoulder External Rotation in 45 Degrees Abduction  - 2 x daily - 7 x weekly - 1-2 sets - 10 reps - 3 sec  hold - Shoulder External Rotation and Scapular Retraction with Resistance  - 2 x daily - 7 x weekly - 1 sets - 10 reps - 3-5 sec  hold - Shoulder External Rotation with Resistance  - 2 x daily - 7 x weekly - 1 sets - 10 reps - 3 sec  hold - Doorway Pec Stretch at 60 Degrees Abduction  - 3 x daily - 7 x weekly - 1 sets - 3 reps - Doorway Pec Stretch at 90 Degrees Abduction  - 3 x daily - 7 x weekly - 1 sets - 3 reps -  30 seconds  hold - Doorway Pec Stretch at 120 Degrees Abduction  - 3 x daily - 7 x weekly - 1 sets - 3 reps - 30 second hold     ASSESSMENT:  CLINICAL IMPRESSION: Patient is a 26 y.o. female who was seen today for physical therapy evaluation and treatment for pain and "catching" in Lt and Rt shoulders with no  known injury or precipitating factors. Symptoms have been present for ~ 1 year in Lt shoulder and ~ 1 month in Rt shoulder. Patient has poor posture and alignment with scapulae abducted and rotated along the thoracic wall bringing head of the humerus and therefore the [facility] joint forward increasing instability in the [facility] joint. Patient has good strength and ROM with some limited end range mobility in shoulder elevation.     OBJECTIVE IMPAIRMENTS decreased mobility, decreased ROM, decreased strength, impaired flexibility, impaired UE functional use, improper body mechanics, postural dysfunction, obesity, and pain.   ACTIVITY LIMITATIONS carrying, lifting, dressing, reach over head, and hygiene/grooming  PARTICIPATION LIMITATIONS: occupation  PERSONAL FACTORS Fitness and 1 comorbidity: obesity  are also affecting patient's functional outcome.   REHAB POTENTIAL: Good  CLINICAL DECISION MAKING: Stable/uncomplicated  EVALUATION COMPLEXITY: Low   GOALS: Goals reviewed with patient? Yes    LONG TERM GOALS: Target date: 11/22/2021  (Remove Blue Hyperlink)  Decrease and eliminate episodes of popping and pain in bilat shoulders Baseline:  Goal status: INITIAL  2.  Improve posture and alignment with patient to demonstrate increased upright posture with posterior shoulder girdle engaged  Baseline:  Goal status: INITIAL  3.  Increased postural strength and stability through the posterior shoulder girdle  Baseline:  Goal status: INITIAL  4.  Independent in HEP Baseline:  Goal status: INITIAL  5.  Improve functional limitation score to 76 Baseline:  Goal status: INITIAL     PLAN: PT FREQUENCY: 1-2x/week  PT DURATION: 6 weeks  PLANNED INTERVENTIONS: Therapeutic exercises, Therapeutic activity, Neuromuscular re-education, Patient/Family education, Self Care, Joint mobilization, Aquatic Therapy, Dry Needling, Manual therapy, and Re-evaluation  PLAN FOR NEXT SESSION: review and  progress exercise with focus on improving posterior shoulder girdle strength    Lyndel Sarate Rober Minion, PT, MPH  10/11/2021, 1:52 PM

## 2021-10-26 ENCOUNTER — Encounter: Payer: Self-pay | Admitting: Obstetrics and Gynecology

## 2021-10-30 ENCOUNTER — Ambulatory Visit: Payer: 59 | Admitting: Family Medicine

## 2021-10-31 ENCOUNTER — Encounter: Payer: Self-pay | Admitting: Obstetrics and Gynecology

## 2021-11-07 ENCOUNTER — Telehealth (INDEPENDENT_AMBULATORY_CARE_PROVIDER_SITE_OTHER): Payer: 59 | Admitting: Family Medicine

## 2021-11-07 ENCOUNTER — Encounter: Payer: Self-pay | Admitting: Family Medicine

## 2021-11-07 DIAGNOSIS — M25812 Other specified joint disorders, left shoulder: Secondary | ICD-10-CM

## 2021-11-07 DIAGNOSIS — S43001D Unspecified subluxation of right shoulder joint, subsequent encounter: Secondary | ICD-10-CM | POA: Diagnosis not present

## 2021-11-07 NOTE — Assessment & Plan Note (Signed)
Her shoulder hasn't gotten stuck since last being seen.  - counseled on home exercise therapy and supportive care - could consider shockwave therapy or bracing.

## 2021-11-07 NOTE — Progress Notes (Signed)
Virtual Visit via Video Note  I connected with [name] on 11/07/21 at  1:30 PM EDT by a video enabled telemedicine application and verified that I am speaking with the correct person using two identifiers.  Location: Patient: home Provider: office   I discussed the limitations of evaluation and management by telemedicine and the availability of in person appointments. The patient expressed understanding and agreed to proceed.  History of Present Illness:  Ms. Fringer is a 26 year old female is following up for her left and right shoulder pain.  She was able to be seen in physical therapy.  Her shoulders are popping but they have not gotten stuck.   Observations/Objective:   Assessment and Plan:  Impingement of left shoulder:  Has been doing well in the interim.  - counseled on home exercise therapy and supportive care - could consider shockwave therapy or further imaging.  Subluxation of right shoulder:  Her shoulder hasn't gotten stuck since last being seen.  - counseled on home exercise therapy and supportive care - could consider shockwave therapy or bracing.   Follow Up Instructions:    I discussed the assessment and treatment plan with the patient. The patient was provided an opportunity to ask questions and all were answered. The patient agreed with the plan and demonstrated an understanding of the instructions.   The patient was advised to call back or seek an in-person evaluation if the symptoms worsen or if the condition fails to improve as anticipated.   Clare Gandy, MD

## 2021-11-07 NOTE — Assessment & Plan Note (Signed)
Has been doing well in the interim.  - counseled on home exercise therapy and supportive care - could consider shockwave therapy or further imaging.

## 2021-12-14 ENCOUNTER — Encounter: Payer: Self-pay | Admitting: Family Medicine

## 2021-12-14 ENCOUNTER — Ambulatory Visit (INDEPENDENT_AMBULATORY_CARE_PROVIDER_SITE_OTHER): Payer: 59 | Admitting: Family Medicine

## 2021-12-14 VITALS — BP 95/67 | HR 66 | Temp 98.1°F

## 2021-12-14 DIAGNOSIS — R300 Dysuria: Secondary | ICD-10-CM | POA: Diagnosis not present

## 2021-12-14 DIAGNOSIS — Z7689 Persons encountering health services in other specified circumstances: Secondary | ICD-10-CM

## 2021-12-14 LAB — POCT URINALYSIS DIPSTICK
Bilirubin, UA: NEGATIVE
Blood, UA: NEGATIVE
Glucose, UA: NEGATIVE
Ketones, UA: NEGATIVE
Leukocytes, UA: NEGATIVE
Nitrite, UA: NEGATIVE
Protein, UA: POSITIVE — AB
Spec Grav, UA: 1.015 (ref 1.010–1.025)
Urobilinogen, UA: 0.2 E.U./dL
pH, UA: 6.5 (ref 5.0–8.0)

## 2021-12-14 MED ORDER — CIPROFLOXACIN HCL 500 MG PO TABS: 500.0000 mg | ORAL_TABLET | Freq: Two times a day (BID) | ORAL | 0 refills | Status: AC

## 2021-12-14 MED ORDER — FLUCONAZOLE 150 MG PO TABS: 150.0000 mg | ORAL_TABLET | Freq: Once | ORAL | 0 refills | Status: AC

## 2021-12-14 NOTE — Patient Instructions (Signed)
Hydrate ! Take the new antibiotic every 12 hours for 3 days.  Diflucan is once tablet for possible yeast
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
## 2021-12-15 LAB — URINALYSIS W MICROSCOPIC + REFLEX CULTURE
Bacteria, UA: NONE SEEN /HPF
Bilirubin Urine: NEGATIVE
Glucose, UA: NEGATIVE
Hgb urine dipstick: NEGATIVE
Hyaline Cast: NONE SEEN /LPF
Ketones, ur: NEGATIVE
Leukocyte Esterase: NEGATIVE
Nitrites, Initial: NEGATIVE
Protein, ur: NEGATIVE
RBC / HPF: NONE SEEN /HPF (ref 0–2)
Specific Gravity, Urine: 1.022 (ref 1.001–1.035)
pH: 7 (ref 5.0–8.0)

## 2021-12-15 LAB — NO CULTURE INDICATED

## 2022-04-17 ENCOUNTER — Ambulatory Visit (INDEPENDENT_AMBULATORY_CARE_PROVIDER_SITE_OTHER): Payer: 59 | Admitting: Family Medicine

## 2022-04-17 ENCOUNTER — Encounter: Payer: Self-pay | Admitting: Family Medicine

## 2022-04-17 ENCOUNTER — Other Ambulatory Visit: Payer: Self-pay | Admitting: Student

## 2022-04-17 VITALS — BP 119/83 | HR 75 | Temp 98.7°F | Wt 232.8 lb

## 2022-04-17 DIAGNOSIS — Z7185 Encounter for immunization safety counseling: Secondary | ICD-10-CM

## 2022-04-17 DIAGNOSIS — Z566 Other physical and mental strain related to work: Secondary | ICD-10-CM

## 2022-04-17 NOTE — Progress Notes (Signed)
Abigail Pham , 01/10/1996, 27 y.o., female MRN: [id number] Patient Care Team    Relationship Specialty Notifications Start End  Ma Hillock, DO PCP - General Family Medicine  08/09/21   Wellington Hampshire, MD Consulting Physician Cardiology  12/12/18   Evans Lance, MD Consulting Physician Cardiology  09/22/19     Chief Complaint  Patient presents with   Covid Vaccine     Pt is having anxiety about getting vaccine      Subjective: Pt presents for an OV with complaints of stress and anxiety surrounding the requirement of a COVID-vaccine through her employer.  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  Patient reports that she does mask during work.  Patient does receive her influenza vaccine yearly.  She states that she is "scared "of receiving the COVID-vaccine because she has had an acquaintance go on to have cardiac complications.  This situation is causing her great deal of stress and the fear of unemployment.     09/07/2021    9:51 AM 08/09/2021    1:49 PM 05/22/2019    2:17 PM 03/26/2017    3:04 PM  Depression screen PHQ 2/9  Decreased Interest 0 0 0 2  Down, Depressed, Hopeless 0 0 0 1  PHQ - 2 Score 0 0 0 3  Altered sleeping    2  Tired, decreased energy    2  Change in appetite    1  Feeling bad or failure about yourself     0  Trouble concentrating    2  Moving slowly or fidgety/restless    2  Suicidal thoughts    0  PHQ-9 Score    12    Allergies  Allergen Reactions   Other Other (See Comments)    Antihistamines or other stimulants-heart races   Social History   Social History Narrative   Marital status/children/pets: Married.  G0P0   Education/employment: High school graduate.   Safety:      -Wears a bicycle helmet riding a bike: Yes     -smoke alarm in the home:Yes     - wears seatbelt: Yes     - Feels safe in their relationships: Yes      Past Medical History:  Diagnosis Date   Anxiety with panic     Dysmenorrhea    inproved on Junel Fe1/20   Heart disease    Heart murmur of newborn    History of ADHD 11/06/2013   History of chicken pox    around age 64   SVT (supraventricular tachycardia)    WPW (Wolff-Parkinson-White syndrome)    declined ablation.Hosp at [facility] at one month of age for [place] and pulmonary hemorrhage   History reviewed. No pertinent surgical history. Family History  Problem Relation Age of Onset   Cervical cancer Mother    Alcohol abuse Mother    Depression Mother    Drug abuse Mother    Mental illness Mother    Diabetes Father    Alcohol abuse Father    High blood pressure Father    COPD Father    Mental retardation Father    Heart disease Father    COPD Paternal Grandmother    Hypertension Paternal Grandfather    Alcohol abuse Paternal Grandfather    Stroke Paternal Grandfather    Hearing loss Paternal Grandfather    Allergies as of 04/17/2022       Reactions  Other Other (See Comments)   Antihistamines or other stimulants-heart races        Medication List        Accurate as of April 17, 2022  3:52 PM. If you have any questions, ask your nurse or doctor.          STOP taking these medications    VITAMIN C PO Stopped by: Howard Pouch, DO       TAKE these medications    fluocinonide cream 0.05 % Commonly known as: LIDEX Apply 1 Application topically 2 (two) times daily. Hold until pt request please.thanks   metoprolol succinate 25 MG 24 hr tablet Commonly known as: TOPROL-XL Take 1.5 tablets (37.5 mg total) by mouth daily. What changed: how much to take        All past medical history, surgical history, allergies, family history, immunizations andmedications were updated in the EMR today and reviewed under the history and medication portions of their EMR.     ROS Negative, with the exception of above mentioned in HPI   Objective:  BP 119/83   Pulse 75   Temp 98.7 F (37.1 C)   Wt 232 lb 12.8 oz (105.6 kg)    LMP 03/27/2022   SpO2 96%   BMI 34.88 kg/m  Body mass index is 34.88 kg/m.  Physical Exam Vitals and nursing note reviewed.  Constitutional:      General: She is not in acute distress.    Appearance: Normal appearance. She is not toxic-appearing.     Comments: Very pleasant female.  HENT:     Head: Normocephalic and atraumatic.  Eyes:     General: No scleral icterus.       Right eye: No discharge.        Left eye: No discharge.     Conjunctiva/sclera: Conjunctivae normal.  Pulmonary:     Effort: Pulmonary effort is normal.  Musculoskeletal:     Cervical back: Normal range of motion.  Skin:    Findings: No rash.  Neurological:     Mental Status: She is alert and oriented to person, place, and time. Mental status is at baseline.  Psychiatric:        Mood and Affect: Mood normal.        Behavior: Behavior normal.        Thought Content: Thought content normal.        Judgment: Judgment normal.      No results found. No results found. No results found for this or any previous visit (from the past 24 hour(s)).  Assessment/Plan: Abigail Pham is a 27 y.o. female present for OV for  Work-related stress Encouraged patient to fill out her paperwork and explain the severe anxiety she is experiencing at the thought of being forced to have the COVID-vaccine in order to retain her employment.  See if they are willing to work with her if she wears a mask and gloves etc. Informed her we cannot send a letter in saying there is a medical indication for her not to receive the COVID-vaccine, since she has no contraindications to the vaccine.  We can draft a letter confirming the severe anxiety surrounding the administration of the vaccine is giving her in her daily life.  Patient reports understanding  Vaccine counseling Counseled on COVID-vaccine.  She understands there is no contraindication for her having the vaccine.  Reviewed expectations re: course of current medical  issues. Discussed self-management of symptoms. Outlined signs and symptoms indicating need  for more acute intervention. Patient verbalized understanding and all questions were answered. Patient received an After-Visit Summary.    No orders of the defined types were placed in this encounter.  No orders of the defined types were placed in this encounter.  Referral Orders  No referral(s) requested today     Note is dictated utilizing voice recognition software. Although note has been proof read prior to signing, occasional typographical errors still can be missed. If any questions arise, please do not hesitate to call for verification.   electronically signed by:  Howard Pouch, DO  [place]

## 2022-04-17 NOTE — Patient Instructions (Signed)
No follow-ups on file.        Great to see you today.  I have refilled the medication(s) we provide.   If labs were collected, we will inform you of lab results once received either by echart message or telephone call.   - echart message- for normal results that have been seen by the patient already.   - telephone call: abnormal results or if patient has not viewed results in their echart.  

## 2022-04-18 ENCOUNTER — Telehealth: Payer: Self-pay

## 2022-04-18 LAB — HM PAP SMEAR: Pap: NEGATIVE

## 2022-04-18 NOTE — Telephone Encounter (Signed)
error 

## 2022-06-14 ENCOUNTER — Other Ambulatory Visit: Payer: Self-pay | Admitting: Internal Medicine

## 2022-06-25 ENCOUNTER — Encounter: Payer: Self-pay | Admitting: *Deleted

## 2022-07-02 ENCOUNTER — Other Ambulatory Visit: Payer: Self-pay | Admitting: Internal Medicine

## 2022-07-19 ENCOUNTER — Other Ambulatory Visit: Payer: Self-pay | Admitting: Internal Medicine

## 2022-07-20 ENCOUNTER — Other Ambulatory Visit: Payer: Self-pay | Admitting: Internal Medicine

## 2022-07-20 ENCOUNTER — Telehealth: Payer: Self-pay | Admitting: Internal Medicine

## 2022-07-20 DIAGNOSIS — I456 Pre-excitation syndrome: Secondary | ICD-10-CM

## 2022-07-20 MED ORDER — METOPROLOL SUCCINATE ER 25 MG PO TB24: 37.5000 mg | ORAL_TABLET | Freq: Every day | ORAL | 1 refills | Status: DC

## 2022-07-20 NOTE — Telephone Encounter (Signed)
Pt's medication was sent to pt's pharmacy as requested. Confirmation received.  °

## 2022-07-20 NOTE — Telephone Encounter (Signed)
*  STAT* If patient is at the pharmacy, call can be transferred to refill team.   1. Which medications need to be refilled? (please list name of each medication and dose if known) metoprolol succinate (TOPROL-XL) 25 MG 24 hr tablet   2. Which pharmacy/location (including street and city if local pharmacy) is medication to be sent to?  [id number] - [place], [place] - [address]    3. Do they need a 30 day or 90 day supply? 90   Patient has appt on 6/21

## 2022-07-25 MED ORDER — METOPROLOL SUCCINATE ER 25 MG PO TB24: 37.5000 mg | ORAL_TABLET | Freq: Every day | ORAL | 1 refills | Status: DC

## 2022-07-25 NOTE — Addendum Note (Signed)
Addended by: Bennie Dallas C on: 07/25/2022 08:38 AM   Modules accepted: Orders

## 2022-07-25 NOTE — Telephone Encounter (Signed)
Message received Pt needed refill  Pt called 07/25/2022, pt advised [facility], but Pt overdue for follow up appointment. Pt encouraged to call [facility] and schedule follow up appointment.

## 2022-08-30 NOTE — Progress Notes (Deleted)
.   Electrophysiology Office Note:   Date:  08/30/2022  ID:  Abigail Pham, DOB 05/16/1995, MRN [id number]  Primary Cardiologist: None Electrophysiologist: None  {Click to update primary MD,subspecialty MD or APP then REFRESH:1}    History of Present Illness:   Abigail Pham is a 27 y.o. female who carries a hx of anxiety, ADHD, pre-excitation / WPW seen today for routine electrophysiology followup.   Last seen in EP Clinic in 01/2021 with discussion for EP study & ablation but plan for watchful waiting as largely controlled on Toprol.   Since last being seen in our clinic the patient reports doing ***.  she denies chest pain, palpitations, dyspnea, PND, orthopnea, nausea, vomiting, dizziness, syncope, edema, weight gain, or early satiety.   Review of systems complete and found to be negative unless listed in HPI.   EP Information / Studies Reviewed:    Studies 11/2018 ECHO > LVEF 50-55%, normal LV function/size, global RV systolic function normal, mild to moderate MV regurgitation  11/2018 LTM > PVC's with intermittent ST, toprol added   Arrhythmia / Device  WPW - on Toprol   AAD / Anticoagulation     Risk Assessment/Calculations:     No BP recorded.  {Refresh Note OR Click here to enter BP  :1}***        Physical Exam:   VS:  There were no vitals taken for this visit.   Wt Readings from Last 3 Encounters:  04/17/22 232 lb 12.8 oz (105.6 kg)  11/07/21 235 lb (106.6 kg)  09/27/21 235 lb (106.6 kg)     GEN: Well nourished, well developed in no acute distress NECK: No JVD; No carotid bruits CARDIAC: {EPRHYTHM:28826}, no murmurs, rubs, gallops RESPIRATORY:  Clear to auscultation without rales, wheezing or rhonchi  ABDOMEN: Soft, non-tender, non-distended EXTREMITIES:  No edema; No deformity   ASSESSMENT AND PLAN:    Intermittent Ventricular Pre-Excitation / WPW Previously noted to have pre-excitation during slow sinus rates & when vagal tone was increased, no SVT when she  wore a monitor.  -continue Toprol ***  Palpitations / SVT  -controlled on BB *** -  Follow up with Dr. Ladona Ridgel {EPFOLLOW NW:29562}  Signed, Canary Brim, MSN, APRN, NP-C, AGACNP-BC [facility] - Electrophysiology  08/30/2022, 4:58 PM

## 2022-08-31 ENCOUNTER — Telehealth: Payer: Self-pay | Admitting: *Deleted

## 2022-08-31 ENCOUNTER — Ambulatory Visit: Payer: 59 | Admitting: Physician Assistant

## 2022-08-31 NOTE — Telephone Encounter (Signed)
    Primary Cardiologist: Dr. Kirke Corin  Chart reviewed as part of pre-operative protocol coverage. Simple dental extractions are considered low risk procedures per guidelines and generally do not require any specific cardiac clearance. It is also generally accepted that for simple extractions and dental cleanings, there is no need to interrupt blood thinner therapy.   SBE prophylaxis is not required for the patient.  I will route this recommendation to the requesting party via Epic fax function and remove from pre-op pool.  Please call with questions.  Ronney Asters, NP 08/31/2022, 12:19 PM

## 2022-08-31 NOTE — Telephone Encounter (Signed)
   Pre-operative Risk Assessment    Patient Name: Abigail Pham  DOB: [birth year] MRN: [id number]     Request for Surgical Clearance    Procedure:  Dental Extraction - Amount of Teeth to be Pulled:  SURGICAL EXTRACTION OF ONE TOOTH  : PROCEDURE WILL REQUIRE A SOFT TISSUE FLAP TO BE ELEVATED AND BONE REMOVED Date of Surgery:  Clearance TBD                                 Surgeon:  DR. Ihor Gully, DDS Surgeon's Group or Practice Name:  [facility] Phone number:  [phone] Fax number:  [phone]   Type of Clearance Requested:   - Medical ; NO MEDICATIONS LISTED AS NEEDING TO BE HELD   Type of Anesthesia:  Local  WITH EPI   Additional requests/questions:    Elpidio Anis   08/31/2022, 12:04 PM

## 2022-10-22 NOTE — Progress Notes (Unsigned)
Cardiology Office Note Date:  10/24/2022  Patient ID:  Abigail, Pham 1995-12-27, MRN [id number] PCP:  Natalia Leatherwood, DO  Electrophysiologist: Dr. Ladona Ridgel    Chief Complaint:  1 year  History of Present Illness: Abigail Pham is a 27 y.o. female with history of obesity, SVT, pre-excitation  She saw Dr. Ladona Ridgel 11/30/19, reports subtle pre-excitation doing well on betablocker. Discussed ablation, planned to continue watchful waiting on BB.  Urged weight loss  She saw A. Tillery, PA-C 01/31/21, brief palpitations a couple times a month, encouraged hydration and exercise,BB continued without change  TODAY She has started to have palpitations/tachycardias On 2 occasions in the last month or so, HRs have reached 180's-200's Both occasions occurred at rest, no clear trigger She has reduced the amount of nicotine in her Vape (even before they started) She has also had times when at rest/in bed that her heart beat feels really slow, then a few fast beats and slow again, not associated with symptoms, but is aware of it. Her tachycardia makes her feel weak, lightheaded, with some CP Leaves her feeling tired, fatigued, generally weak  Otherwise feels well No CP outside of her tachycardia No SOB She has good medication compliance No missed doses Takes her metoprolol at night  Past Medical History:  Diagnosis Date   Anxiety with panic    Dysmenorrhea    inproved on Junel Fe1/20   Heart disease    Heart murmur of newborn    History of ADHD 11/06/2013   History of chicken pox    around age 16   SVT (supraventricular tachycardia)    WPW (Wolff-Parkinson-White syndrome)    declined ablation.Hosp at [facility] at one month of age for Tachyarrrythmia and pulmonary hemorrhage    No past surgical history on file.  Current Outpatient Medications  Medication Sig Dispense Refill   fluocinonide cream (LIDEX) 0.05 % Apply 1 Application topically 2 (two) times daily. Hold until pt request  please.thanks 60 g 0   metoprolol succinate (TOPROL-XL) 25 MG 24 hr tablet Take 1 tablet (25 mg total) by mouth daily. 90 tablet 2   No current facility-administered medications for this visit.    Allergies:   Other   Social History:  The patient  reports that she has never smoked. She has been exposed to tobacco smoke. She has never used smokeless tobacco. She reports current alcohol use. She reports that she does not use drugs.   Family History:  The patient's family history includes Alcohol abuse in her father, mother, and paternal grandfather; COPD in her father and paternal grandmother; Cervical cancer in her mother; Depression in her mother; Diabetes in her father; Drug abuse in her mother; Hearing loss in her paternal grandfather; Heart disease in her father; High blood pressure in her father; Hypertension in her paternal grandfather; Mental illness in her mother; Mental retardation in her father; Stroke in her paternal grandfather.  ROS:  Please see the history of present illness.    All other systems are reviewed and otherwise negative.   PHYSICAL EXAM:  VS:  BP 110/62   Pulse 84   Ht 5\' 9"  (1.753 m)   Wt 235 lb (106.6 kg)   SpO2 98%   BMI 34.70 kg/m  BMI: Body mass index is 34.7 kg/m. Well nourished, well developed, in no acute distress HEENT: normocephalic, atraumatic Neck: no JVD, carotid bruits or masses Cardiac:   RRR; no significant murmurs, no rubs, or gallops Lungs:  CTA b/l, no wheezing, rhonchi or rales Abd: soft, nontender MS: no deformity or atrophy Ext: no edema Skin: warm and dry, no rash Neuro:  No gross deficits appreciated Psych: euthymic mood, full affect   EKG:  Done today and reviewed by myself shows  SR 84bpm, no pre-excitation noted  EKG Interpretation Date/Time:  Wednesday October 24 2022 14:46:35 EDT Ventricular Rate:  87 PR Interval:  146 QRS Duration:  80 QT Interval:  362 QTC Calculation: 435 R Axis:   74  Text  Interpretation: Normal sinus rhythm Normal ECG When compared with ECG of 24-Oct-2022 14:46, No significant change was found Confirmed by Francis Dowse ([id number]) on 10/24/2022 3:35:26 PM      12/05/2018: TTE 1. Left ventricular ejection fraction, by visual estimation, is 50 to  55%. The left ventricle has normal function. Normal left ventricular size.  There is no left ventricular hypertrophy.   2. Global right ventricle has normal systolic function.The right  ventricular size is normal. No increase in right ventricular wall  thickness.   3. Left atrial size was normal.   4. The mitral valve is normal in structure. Mild to moderate mitral valve  regurgitation.   5. TR signal is inadequate for assessing pulmonary artery systolic  pressure.   Sept 2020, monitor Normal sinus rhythm with an average heart rate of 92 bpm. Intermittent delta wave noted consistent with preexcitation. Rare PACs and rare PVCs. No evidence of SVT. Triggered events by the patient correlated with sinus tachycardia and PVCs  Recent Labs: No results found for requested labs within last 365 days.  No results found for requested labs within last 365 days.   CrCl cannot be calculated (Patient's most recent lab result is older than the maximum 21 days allowed.).   Wt Readings from Last 3 Encounters:  10/24/22 235 lb (106.6 kg)  04/17/22 232 lb 12.8 oz (105.6 kg)  11/07/21 235 lb (106.6 kg)     Other studies reviewed: Additional studies/records reviewed today include: summarized above  ASSESSMENT AND PLAN:  SVT ? WPW symptomatic No pre excitation noted on today's EKG Recent flare up of her tachycardia  She remains hesitant to pursue ablation but will give it some thought Will increase her metoprolol slightly to 25mg  BID 2 weeks monitor Encouraged continued reduction in nicotine >> to stopping VAPE Discussed vagal maneuvers  She is certain she is not pregnant, reports normal/monthly menses, last was last  week   Disposition: F/u with is in a month or so, sooner if needed    Current medicines are reviewed at length with the patient today.  The patient did not have any concerns regarding medicines.  Norma Fredrickson, PA-C 10/24/2022 5:14 PM     [facility] [address] [place] [address] [phone] (office)  [phone] (fax)

## 2022-10-24 ENCOUNTER — Ambulatory Visit: Payer: 59 | Attending: Physician Assistant | Admitting: Physician Assistant

## 2022-10-24 ENCOUNTER — Other Ambulatory Visit (INDEPENDENT_AMBULATORY_CARE_PROVIDER_SITE_OTHER): Payer: 59

## 2022-10-24 ENCOUNTER — Encounter: Payer: Self-pay | Admitting: Physician Assistant

## 2022-10-24 ENCOUNTER — Other Ambulatory Visit: Payer: Self-pay | Admitting: Physician Assistant

## 2022-10-24 VITALS — BP 110/62 | HR 84 | Ht 69.0 in | Wt 235.0 lb

## 2022-10-24 DIAGNOSIS — R55 Syncope and collapse: Secondary | ICD-10-CM

## 2022-10-24 DIAGNOSIS — I456 Pre-excitation syndrome: Secondary | ICD-10-CM

## 2022-10-24 DIAGNOSIS — R42 Dizziness and giddiness: Secondary | ICD-10-CM | POA: Diagnosis not present

## 2022-10-24 DIAGNOSIS — I471 Supraventricular tachycardia, unspecified: Secondary | ICD-10-CM

## 2022-10-24 MED ORDER — METOPROLOL SUCCINATE ER 25 MG PO TB24: 25.0000 mg | ORAL_TABLET | Freq: Every day | ORAL | 2 refills | Status: DC

## 2022-10-24 NOTE — Progress Notes (Unsigned)
Enrolled for [employer] to mail a ZIO AT Live Telemetry monitor to patients address on file.   Dr. Taylor to read. 

## 2022-10-24 NOTE — Patient Instructions (Addendum)
Medication Instructions:    START TAKING:  METOPROLOL 25 MG ONCE A DAY    *If you need a refill on your cardiac medications before your next appointment, please call your pharmacy*   Lab Work: NONE ORDERED  TODAY   If you have labs (blood work) drawn today and your tests are completely normal, you will receive your results only by: MyChart Message (if you have MyChart) OR A paper copy in the mail If you have any lab test that is abnormal or we need to change your treatment, we will call you to review the results.   Testing/Procedures: Your physician has recommended that you wear an event monitor. Event monitors are medical devices that record the heart's electrical activity. Doctors most often [place] these monitors to diagnose arrhythmias. Arrhythmias are problems with the speed or rhythm of the heartbeat. The monitor is a small, portable device. You can wear one while you do your normal daily activities. This is usually used to diagnose what is causing palpitations/syncope (passing out).      Follow-Up: At [facility], you and your health needs are our priority.  As part of our continuing mission to provide you with exceptional heart care, we have created designated Provider Care Teams.  These Care Teams include your primary Cardiologist (physician) and Advanced Practice Providers (APPs -  Physician Assistants and Nurse Practitioners) who all work together to provide you with the care you need, when you need it.  We recommend signing up for the patient portal called "MyChart".  Sign up information is provided on this After Visit Summary.  MyChart is used to connect with patients for Virtual Visits (Telemedicine).  Patients are able to view lab/test results, encounter notes, upcoming appointments, etc.  Non-urgent messages can be sent to your provider as well.   To learn more about what you can do with MyChart, go to [email or web address].    Your next appointment:   4 -6  week(s)  Provider:   Lewayne Bunting, MD    Other Instructions  Abigail Pham- Long Term Monitor Instructions  Your physician has requested you wear a ZIO patch monitor for 14 days.  This is a single patch monitor. [employer] supplies one patch monitor per enrollment. Additional stickers are not available. Please do not apply patch if you will be having a Nuclear Stress Test,  Echocardiogram, Cardiac CT, MRI, or Chest Xray during the period you would be wearing the  monitor. The patch cannot be worn during these tests. You cannot remove and re-apply the  ZIO XT patch monitor.  Your ZIO patch monitor will be mailed 3 day USPS to your address on file. It may take 3-5 days  to receive your monitor after you have been enrolled.  Once you have received your monitor, please review the enclosed instructions. Your monitor  has already been registered assigning a specific monitor serial # to you.  Billing and Patient Assistance Program Information  We have supplied [employer] with any of your insurance information on file for billing purposes. [employer] offers a sliding scale Patient Assistance Program for patients that do not have  insurance, or whose insurance does not completely cover the cost of the ZIO monitor.  You must apply for the Patient Assistance Program to qualify for this discounted rate.  To apply, please call [employer] at [phone], select option 4, select option 2, ask to apply for  Patient Assistance Program. Abigail Pham will ask your household income, and how many people  are in  your household. They will quote your out-of-pocket cost based on that information.  [employer] will also be able to set up a 49-month, interest-free payment plan if needed.  Applying the monitor   Shave hair from upper left chest.  Hold abrader disc by orange tab. Rub abrader in 40 strokes over the upper left chest as  indicated in your monitor instructions.  Clean area with 4 enclosed alcohol pads. Let dry.  Apply  patch as indicated in monitor instructions. Patch will be placed under collarbone on left  side of chest with arrow pointing upward.  Rub patch adhesive wings for 2 minutes. Remove white label marked "1". Remove the white  label marked "2". Rub patch adhesive wings for 2 additional minutes.  While looking in a mirror, press and release button in center of patch. A small green light will  flash 3-4 times. This will be your only indicator that the monitor has been turned on.  Do not shower for the first 24 hours. You may shower after the first 24 hours.  Press the button if you feel a symptom. You will hear a small click. Record Date, Time and  Symptom in the Patient Logbook.  When you are ready to remove the patch, follow instructions on the last 2 pages of Patient  Logbook. Stick patch monitor onto the last page of Patient Logbook.  Place Patient Logbook in the blue and white box. Use locking tab on box and tape box closed  securely. The blue and white box has prepaid postage on it. Please place it in the mailbox as  soon as possible. Your physician should have your test results approximately 7 days after the  monitor has been mailed back to [facility].  Call [facility] Customer Care at [phone] if you have questions regarding  your ZIO XT patch monitor. Call them immediately if you see an orange light blinking on your  monitor.  If your monitor falls off in less than 4 days, contact our Monitor department at [phone].  If your monitor becomes loose or falls off after 4 days call [employer] at [phone] for  suggestions on securing your monitor

## 2022-10-26 DIAGNOSIS — R55 Syncope and collapse: Secondary | ICD-10-CM | POA: Diagnosis not present

## 2022-10-26 DIAGNOSIS — I456 Pre-excitation syndrome: Secondary | ICD-10-CM

## 2022-11-08 ENCOUNTER — Telehealth: Payer: Self-pay

## 2022-11-08 NOTE — Telephone Encounter (Signed)
Called pt to set up appt with Dr. Ladona Ridgel.(Second attempt) We were going to wait until results on monitor were back to set appt but schedule is filling and I wanted to go ahead a get her scheduled. Current target appt date mid-Nov.

## 2022-11-23 ENCOUNTER — Other Ambulatory Visit: Payer: Self-pay | Admitting: Internal Medicine

## 2022-11-23 DIAGNOSIS — I456 Pre-excitation syndrome: Secondary | ICD-10-CM

## 2022-12-25 ENCOUNTER — Telehealth: Payer: Self-pay

## 2022-12-25 NOTE — Telephone Encounter (Signed)
-----   Message from Nurse Pablo Lawrence sent at 12/24/2022  6:11 PM EDT -----  ----- Message ----- From: Sheilah Pigeon, PA-C Sent: 12/21/2022   4:58 PM EDT To: Oleta Mouse, CMA  No SVT, she did have some PVCs Pt triggered episodes associated with PVCs though burden low at <1%  Is she feeling any better on the new metoprolol dose?

## 2022-12-25 NOTE — Telephone Encounter (Signed)
Attempted to contact patient, left message to call our office back

## 2022-12-25 NOTE — Telephone Encounter (Signed)
Patient is returning call and is requesting call back.  

## 2022-12-26 NOTE — Telephone Encounter (Signed)
Left voicemail to return call to office.

## 2023-01-02 NOTE — Telephone Encounter (Signed)
Third and final attempt to reach patient was unsuccessful. No DPR permission on file. Left message asking that she call office back to review results. Closing current encounter until pt calls back.

## 2023-01-16 ENCOUNTER — Ambulatory Visit: Payer: 59 | Admitting: Internal Medicine

## 2023-04-25 ENCOUNTER — Other Ambulatory Visit: Payer: Self-pay | Admitting: Internal Medicine

## 2023-04-25 DIAGNOSIS — I456 Pre-excitation syndrome: Secondary | ICD-10-CM

## 2023-05-16 ENCOUNTER — Ambulatory Visit: Payer: 59 | Attending: Internal Medicine | Admitting: Internal Medicine

## 2023-05-16 ENCOUNTER — Encounter: Payer: Self-pay | Admitting: Internal Medicine

## 2023-05-16 VITALS — BP 116/80 | HR 78 | Ht 70.5 in | Wt 243.0 lb

## 2023-05-16 DIAGNOSIS — I456 Pre-excitation syndrome: Secondary | ICD-10-CM | POA: Diagnosis not present

## 2023-05-16 NOTE — Progress Notes (Signed)
 HPI Ms. Sergent returns today for followup. She is a pleasant 28 yo woman with morbid obesity and a h/o SVT. She had very subtle pre-excitation. She has been on a beta blocker and her symptoms are much improved. She has put on hold getting pregnant. Since I saw her last over 3 years ago she notes that she has had only occasional symptoms of SVT Allergies  Allergen Reactions   Other Other (See Comments)    Antihistamines or other stimulants-heart races     Current Outpatient Medications  Medication Sig Dispense Refill   fluocinonide cream (LIDEX) 0.05 % Apply 1 Application topically 2 (two) times daily. Hold until pt request please.thanks 60 g 0   metoprolol succinate (TOPROL-XL) 25 MG 24 hr tablet TAKE 1 AND 1/2 TABLETS DAILY BY MOUTH 135 tablet 2   No current facility-administered medications for this visit.     Past Medical History:  Diagnosis Date   Anxiety with panic    Dysmenorrhea    inproved on Junel Fe1/20   Heart disease    Heart murmur of newborn    History of ADHD 11/06/2013   History of chicken pox    around age 3   SVT (supraventricular tachycardia) (HCC)    WPW (Wolff-Parkinson-White syndrome)    declined ablation.Hosp at [facility] at one month of age for Tachyarrrythmia and pulmonary hemorrhage    ROS:   All systems reviewed and negative except as noted in the HPI.   History reviewed. No pertinent surgical history.   Family History  Problem Relation Age of Onset   Cervical cancer Mother    Alcohol abuse Mother    Depression Mother    Drug abuse Mother    Mental illness Mother    Diabetes Father    Alcohol abuse Father    High blood pressure Father    COPD Father    Mental retardation Father    Heart disease Father    COPD Paternal Grandmother    Hypertension Paternal Grandfather    Alcohol abuse Paternal Grandfather    Stroke Paternal Grandfather    Hearing loss Paternal Grandfather      Social History   Socioeconomic History   Marital  status: Married    Spouse name: Not on file   Number of children: Not on file   Years of education: Not on file   Highest education level: Not on file  Occupational History   Not on file  Tobacco Use   Smoking status: Never    Passive exposure: Yes   Smokeless tobacco: Never   Tobacco comments:    pt smokes vapors occ with nicotine 3 mg   Vaping Use   Vaping status: Every Day   Substances: Nicotine, Flavoring  Substance and Sexual Activity   Alcohol use: Yes    Alcohol/week: 0.0 standard drinks of alcohol    Comment: occ   Drug use: No   Sexual activity: Yes    Partners: Male    Birth control/protection: Condom  Other Topics Concern   Not on file  Social History Narrative   Marital status/children/pets: Married.  G0P0   Education/employment: High school graduate.   Safety:      -Wears a bicycle helmet riding a bike: Yes     -smoke alarm in the home:Yes     - wears seatbelt: Yes     - Feels safe in their relationships: Yes      Social Drivers of Health  Financial Resource Strain: Not on file  Food Insecurity: Not on file  Transportation Needs: Not on file  Physical Activity: Not on file  Stress: Not on file  Social Connections: Unknown (07/24/2021)   Received from [facility], [facility]   Social Network    Social Network: Not on file  Intimate Partner Violence: Unknown (06/15/2021)   Received from [facility], [facility]   HITS    Physically Hurt: Not on file    Insult or Talk Down To: Not on file    Threaten Physical Harm: Not on file    Scream or Curse: Not on file     BP 116/80   Pulse 78   Ht 5' 10.5" (1.791 m)   Wt 243 lb (110.2 kg)   SpO2 97%   BMI 34.37 kg/m   Physical Exam:  Well appearing NAD HEENT: Unremarkable Neck:  No JVD, no thyromegally Lymphatics:  No adenopathy Back:  No CVA tenderness Lungs:  Clear HEART:  Regular rate rhythm, no murmurs, no rubs, no clicks Abd:  soft, positive bowel sounds, no organomegally, no  rebound, no guarding Ext:  2 plus pulses, no edema, no cyanosis, no clubbing Skin:  No rashes no nodules Neuro:  CN II through XII intact, motor grossly intact  EKG - nsr with no pre-excitation  DEVICE  Normal device function.  See PaceArt for details.   Assess/Plan:   SVT - her symptoms are well controlled. She will continue her beta blocker. We discussed the indications for catheter ablation and she is strongly considering as she would like to become pregnant.   2. Obesity - I strongly encouraged the patient to lose weight.    Sharlot Gowda TaylorMD

## 2023-05-16 NOTE — Patient Instructions (Addendum)
 Medication Instructions:  Your physician recommends that you continue on your current medications as directed. Please refer to the Current Medication list given to you today.  *If you need a refill on your cardiac medications before your next appointment, please call your pharmacy*  Lab Work: None ordered.  You may go to any [facility] Location for your lab work:  [facility] - [address] ([facility]) - [address] [phone] [address]  [place] - [address]   [place]  - [address]   [place] - [address] - [address] [employer]  - [address] ([facility]   If you have labs (blood work) drawn today and your tests are completely normal, you will receive your results only by: [employer] (if you have MyChart)  If you have any lab test that is abnormal or we need to change your treatment, we will call you or send a MyChart message to review the results.  Testing/Procedures: Will message with later dates for procedure  Follow-Up: At [facility], you and your health needs are our priority.  As part of our continuing mission to provide you with exceptional heart care, we have created designated Provider Care Teams.  These Care Teams include your primary Cardiologist (physician) and Advanced Practice Providers (APPs -  Physician Assistants and Nurse Practitioners) who all work together to provide you with the care you need, when you need it.  Your next appointment:   To be scheduled  The format for your next appointment:   In Person  Provider:   Lewayne Bunting, MD{or one of the following Advanced Practice Providers on your designated Care Team:   Francis Dowse, [place] Casimiro Needle "Mardelle Matte" [place], [place] Earnest Rosier, NP           Valet parking services will be available as well.

## 2023-06-04 ENCOUNTER — Encounter: Payer: Self-pay | Admitting: Internal Medicine

## 2023-06-30 ENCOUNTER — Encounter: Payer: Self-pay | Admitting: Internal Medicine

## 2023-07-03 ENCOUNTER — Ambulatory Visit (INDEPENDENT_AMBULATORY_CARE_PROVIDER_SITE_OTHER): Admitting: Family Medicine

## 2023-07-03 ENCOUNTER — Encounter: Payer: Self-pay | Admitting: Internal Medicine

## 2023-07-03 ENCOUNTER — Encounter: Payer: Self-pay | Admitting: Family Medicine

## 2023-07-03 VITALS — BP 118/74 | HR 98 | Temp 98.3°F | Wt 240.0 lb

## 2023-07-03 DIAGNOSIS — F419 Anxiety disorder, unspecified: Secondary | ICD-10-CM | POA: Diagnosis not present

## 2023-07-03 DIAGNOSIS — R300 Dysuria: Secondary | ICD-10-CM | POA: Diagnosis not present

## 2023-07-03 DIAGNOSIS — I456 Pre-excitation syndrome: Secondary | ICD-10-CM | POA: Diagnosis not present

## 2023-07-03 DIAGNOSIS — E876 Hypokalemia: Secondary | ICD-10-CM | POA: Diagnosis not present

## 2023-07-03 DIAGNOSIS — I471 Supraventricular tachycardia, unspecified: Secondary | ICD-10-CM | POA: Insufficient documentation

## 2023-07-03 LAB — BASIC METABOLIC PANEL WITH GFR
BUN: 11 mg/dL (ref 6–23)
CO2: 26 meq/L (ref 19–32)
Calcium: 9.4 mg/dL (ref 8.4–10.5)
Chloride: 103 meq/L (ref 96–112)
Creatinine, Ser: 0.85 mg/dL (ref 0.40–1.20)
GFR: 92.94 mL/min (ref >60.00)
Glucose, Bld: 121 mg/dL — ABNORMAL HIGH (ref 70–99)
Potassium: 4 meq/L (ref 3.5–5.1)
Sodium: 138 meq/L (ref 135–145)

## 2023-07-03 MED ORDER — CLONAZEPAM 0.5 MG PO TABS: ORAL_TABLET | ORAL | 5 refills | Status: AC

## 2023-07-03 MED ORDER — SERTRALINE HCL 50 MG PO TABS: 50.0000 mg | ORAL_TABLET | Freq: Every day | ORAL | 2 refills | Status: DC

## 2023-07-03 NOTE — Patient Instructions (Addendum)
 Return in about 4 weeks (around 07/31/2023) for anxiety.  Bananas, oranges, cantaloupe, grapefruit ,prunes, raisins, and dates,Cooked spinach., Cooked broccoli.,Potatoes.,Sweet potatoes,Mushrooms, Peas,Cucumbers       Great to see you today.  I have refilled the medication(s) we provide.   If labs were collected or images ordered, we will inform you of  results once we have received them and reviewed. We will contact you either by echart message, or telephone call.  Please give ample time to the testing facility, and our office to run,  receive and review results. Please do not call inquiring of results, even if you can see them in your chart. We will contact you as soon as we are able. If it has been over 1 week since the test was completed, and you have not yet heard from us , then please call us .    - echart message- for normal results that have been seen by the patient already.   - telephone call: abnormal results or if patient has not viewed results in their echart.  If a referral to a specialist was entered for you, please call us  in 2 weeks if you have not heard from the specialist office to schedule.

## 2023-07-03 NOTE — Progress Notes (Signed)
 Abigail Pham , 06-10-1995, 28 y.o., female MRN: [id number] Patient Care Team    Relationship Specialty Notifications Start End  Mariel Shope, DO PCP - General Family Medicine  08/09/21   Wenona Hamilton, MD Consulting Physician Cardiology  12/12/18   Tammie Fall, MD Consulting Physician Cardiology  09/22/19     Chief Complaint  Patient presents with   Hospitalization Follow-up     Subjective: Abigail Pham is a 28 y.o. Pt presents for an OV with complaints of increased anxiousness.  Patient reports she is always been somewhat anxious, but since her WPW is started to cause more symptoms, she finds herself even more anxious.  She also has decided to go through with the ablation in July, which is also causing her more anxiety.  She reports she has never been prescribed medication for her anxiety.  She had been referred to a therapist in the past, but does not feel like that works well for her. She was recently seen in the ED with rather significant episode of WPW.  She is compliant with her metoprolol .  She does have cardiology follow-up.  She had mildly low potassium of 3.2 during that visit.  She reports she was supplemented with 1 tab of potassium she does not know the dose.  She is trying to increase the potassium content in her diet since the hospitalization.  She also complains of burning with urination that started a few days ago.  She states she is prone to bacterial vaginosis, but wants to be certain this is not a UTI.      04/17/2022    8:28 AM 09/07/2021    9:51 AM 08/09/2021    1:49 PM 05/22/2019    2:17 PM 03/26/2017    3:04 PM  Depression screen PHQ 2/9  Decreased Interest 0 0 0 0 2  Down, Depressed, Hopeless 0 0 0 0 1  PHQ - 2 Score 0 0 0 0 3  Altered sleeping 0    2  Tired, decreased energy 0    2  Change in appetite 0    1  Feeling bad or failure about yourself  0    0  Trouble concentrating 0    2  Moving slowly or fidgety/restless 0    2  Suicidal  thoughts 0    0  PHQ-9 Score 0    12    Allergies  Allergen Reactions   Other Other (See Comments)    Antihistamines or other stimulants-heart races   Social History   Social History Narrative   Marital status/children/pets: Married.  G0P0   Education/employment: High school graduate.   Safety:      -Wears a bicycle helmet riding a bike: Yes     -smoke alarm in the home:Yes     - wears seatbelt: Yes     - Feels safe in their relationships: Yes      Past Medical History:  Diagnosis Date   Anxiety with panic    Dysmenorrhea    inproved on Junel Fe1/20   Heart disease    Heart murmur of newborn    History of ADHD 11/06/2013   History of chicken pox    around age 34   SVT (supraventricular tachycardia) (HCC)    WPW (Wolff-Parkinson-White syndrome)    declined ablation.Hosp at [facility] at one month of age for Tachyarrrythmia and pulmonary hemorrhage   History reviewed. No pertinent surgical history. Family History  Problem Relation Age of Onset   Cervical cancer Mother    Alcohol abuse Mother    Depression Mother    Drug abuse Mother    Mental illness Mother    Diabetes Father    Alcohol abuse Father    High blood pressure Father    COPD Father    Mental retardation Father    Heart disease Father    COPD Paternal Grandmother    Hypertension Paternal Grandfather    Alcohol abuse Paternal Grandfather    Stroke Paternal Grandfather    Hearing loss Paternal Grandfather    Allergies as of 07/03/2023       Reactions   Other Other (See Comments)   Antihistamines or other stimulants-heart races        Medication List        Accurate as of July 03, 2023 12:34 PM. If you have any questions, ask your nurse or doctor.          STOP taking these medications    fluocinonide  cream 0.05 % Commonly known as: LIDEX  Stopped by: Napolean Backbone       TAKE these medications    clonazePAM  0.5 MG tablet Commonly known as: KLONOPIN  1/2 tab in the day if needed for  panic, 1 tab before bed. Started by: Dayanara Sherrill   metoprolol  succinate 25 MG 24 hr tablet Commonly known as: TOPROL -XL TAKE 1 AND 1/2 TABLETS DAILY BY MOUTH   sertraline  50 MG tablet Commonly known as: ZOLOFT  Take 1 tablet (50 mg total) by mouth daily. Started by: Napolean Backbone        All past medical history, surgical history, allergies, family history, immunizations andmedications were updated in the EMR today and reviewed under the history and medication portions of their EMR.     ROS Negative, with the exception of above mentioned in HPI   Objective:  BP 118/74   Pulse 98   Temp 98.3 F (36.8 C)   Wt 240 lb (108.9 kg)   SpO2 97%   BMI 33.95 kg/m  Body mass index is 33.95 kg/m. Physical Exam Vitals and nursing note reviewed.  Constitutional:      General: She is not in acute distress.    Appearance: Normal appearance. She is normal weight. She is not ill-appearing or toxic-appearing.  HENT:     Head: Normocephalic and atraumatic.  Eyes:     General: No scleral icterus.       Right eye: No discharge.        Left eye: No discharge.     Extraocular Movements: Extraocular movements intact.     Conjunctiva/sclera: Conjunctivae normal.     Pupils: Pupils are equal, round, and reactive to light.  Skin:    Findings: No rash.  Neurological:     Mental Status: She is alert and oriented to person, place, and time. Mental status is at baseline.     Motor: No weakness.     Coordination: Coordination normal.     Gait: Gait normal.  Psychiatric:        Attention and Perception: Attention normal.        Mood and Affect: Mood is anxious. Affect is tearful.        Speech: Speech normal.        Behavior: Behavior normal. Behavior is cooperative.        Thought Content: Thought content normal.        Cognition and Memory: Cognition and memory normal.  Judgment: Judgment normal.      No results found. No results found. No results found for this or any previous  visit (from the past 24 hours).  Assessment/Plan: Abigail Pham is a 28 y.o. female present for OV for  Anxiety-like symptoms (Primary) Discussed options for treating anxiety.  She has been referred to therapy in the last and does not feel she wants to pursue this again. Start Zoloft  50 mg daily We discussed the use of benzodiazepine.  Patient understands this is a controlled substance. Klonopin  0.5 mg nightly to help with sleep, and 0.25 mg in the day as needed for panic only  WPW (Wolff-Parkinson-White syndrome) Compliant with metoprolol  and cardiology follow-ups. Has ablation scheduled for July 2025  Dysuria Will await urine results and prescribe antibiotic if appropriate - Urinalysis w microscopic + reflex cultur Hypokalemia Discussed incorporating more potassium rich foods in diet. - Basic Metabolic Panel (BMET)  Reviewed expectations re: course of current medical issues. Discussed self-management of symptoms. Outlined signs and symptoms indicating need for more acute intervention. Patient verbalized understanding and all questions were answered. Patient received an After-Visit Summary.   Follow-up in 4 weeks, sooner if needed  Orders Placed This Encounter  Procedures   Urinalysis w microscopic + reflex cultur   Basic Metabolic Panel (BMET)   Meds ordered this encounter  Medications   sertraline  (ZOLOFT ) 50 MG tablet    Sig: Take 1 tablet (50 mg total) by mouth daily.    Dispense:  30 tablet    Refill:  2   clonazePAM  (KLONOPIN ) 0.5 MG tablet    Sig: 1/2 tab in the day if needed for panic, 1 tab before bed.    Dispense:  45 tablet    Refill:  5   Referral Orders  No referral(s) requested today     Note is dictated utilizing voice recognition software. Although note has been proof read prior to signing, occasional typographical errors still can be missed. If any questions arise, please do not hesitate to call for verification.   electronically signed  by:  Napolean Backbone, DO  [facility] [place]

## 2023-07-04 ENCOUNTER — Telehealth: Admitting: Family Medicine

## 2023-07-04 ENCOUNTER — Encounter: Payer: Self-pay | Admitting: Family Medicine

## 2023-07-05 ENCOUNTER — Telehealth: Payer: Self-pay | Admitting: Family Medicine

## 2023-07-05 ENCOUNTER — Ambulatory Visit: Payer: Self-pay

## 2023-07-05 LAB — URINALYSIS W MICROSCOPIC + REFLEX CULTURE
Bilirubin Urine: NEGATIVE
Glucose, UA: NEGATIVE
Hgb urine dipstick: NEGATIVE
Hyaline Cast: NONE SEEN /LPF
Nitrites, Initial: NEGATIVE
Protein, ur: NEGATIVE
RBC / HPF: NONE SEEN /HPF (ref 0–2)
Specific Gravity, Urine: 1.027 (ref 1.001–1.035)
pH: <=5 (ref 5.0–8.0)

## 2023-07-05 LAB — URINE CULTURE
MICRO NUMBER:: 16368159
SPECIMEN QUALITY:: ADEQUATE

## 2023-07-05 LAB — CULTURE INDICATED

## 2023-07-05 MED ORDER — METRONIDAZOLE 500 MG PO TABS
500.0000 mg | ORAL_TABLET | Freq: Two times a day (BID) | ORAL | 0 refills | Status: AC
Start: 2023-07-05 — End: 2023-07-12

## 2023-07-05 NOTE — Telephone Encounter (Signed)
 Please inform patient her urine culture was negative for any signs of infection. I called in Flagyl , 1 tab every 12 hours for 7 days to treat for presumed BV

## 2023-07-05 NOTE — Telephone Encounter (Signed)
 LVM to discuss. Sent MyChart.

## 2023-07-05 NOTE — Telephone Encounter (Signed)
 Copied from CRM [id number]. Topic: Clinical - Lab/Test Results >> Jul 05, 2023  1:32 PM Chuck Crater wrote: Reason for CRM: Patient has additional questions regarding test results.   Patient called because she was concerned about taking Flagyl  with her current medications due to possible interactions. I advised her that the message she sent earlier was forwarded to Dr. Marylee Snowball to review and that someone should be reaching out to her with an answer when able. She understood and is agreeable with this plan.    Reason for Disposition  [1] Caller has NON-URGENT medicine question about med that PCP prescribed AND [2] triager unable to answer question  Answer Assessment - Initial Assessment Questions 1. NAME of MEDICINE: "What medicine(s) are you calling about?"     Flagyl  2. QUESTION: "What is your question?" (e.g., double dose of medicine, side effect)     Can she take it with her other medications  3. PRESCRIBER: "Who prescribed the medicine?" Reason: if prescribed by specialist, call should be referred to that group.     Dr. Marylee Snowball  Protocols used: Medication Question Call-A-AH

## 2023-07-05 NOTE — Telephone Encounter (Signed)
 No further action needed.

## 2023-07-25 ENCOUNTER — Encounter: Payer: Self-pay | Admitting: Internal Medicine

## 2023-07-31 ENCOUNTER — Ambulatory Visit: Admitting: Family Medicine

## 2023-08-07 ENCOUNTER — Other Ambulatory Visit: Payer: Self-pay | Admitting: *Deleted

## 2023-08-07 ENCOUNTER — Encounter: Payer: Self-pay | Admitting: *Deleted

## 2023-08-07 DIAGNOSIS — I456 Pre-excitation syndrome: Secondary | ICD-10-CM

## 2023-08-07 DIAGNOSIS — I471 Supraventricular tachycardia, unspecified: Secondary | ICD-10-CM

## 2023-08-07 DIAGNOSIS — Z01812 Encounter for preprocedural laboratory examination: Secondary | ICD-10-CM

## 2023-08-14 ENCOUNTER — Encounter: Payer: Self-pay | Admitting: Family Medicine

## 2023-08-14 ENCOUNTER — Ambulatory Visit (INDEPENDENT_AMBULATORY_CARE_PROVIDER_SITE_OTHER): Admitting: Family Medicine

## 2023-08-14 VITALS — BP 118/78 | HR 98 | Temp 98.1°F | Wt 242.0 lb

## 2023-08-14 DIAGNOSIS — F419 Anxiety disorder, unspecified: Secondary | ICD-10-CM | POA: Diagnosis not present

## 2023-08-14 NOTE — Progress Notes (Signed)
 Abigail Pham , April 05, 1995, 28 y.o., female MRN: [id number] Patient Care Team    Relationship Specialty Notifications Start End  Mariel Shope, DO PCP - General Family Medicine  08/09/21   Wenona Hamilton, MD Consulting Physician Cardiology  12/12/18   Tammie Fall, MD Consulting Physician Cardiology  09/22/19     Chief Complaint  Patient presents with   Anxiety     Subjective: Abigail Pham is a 28 y.o. Pt presents for an OV for Chronic Conditions/illness Management Anxiousness:pt reports she is feeling much better. She tried the zoloft , but it made her nauseated, so she stopped. She is taking the klonopin  as needed only and has not needed one in the last 2 weeks.  She is a little nervous about her upcoming heart ablation for WPW.   Prior note: with complaints of increased anxiousness.  Patient reports she is always been somewhat anxious, but since her WPW is started to cause more symptoms, she finds herself even more anxious.  She also has decided to go through with the ablation in July, which is also causing her more anxiety.  She reports she has never been prescribed medication for her anxiety.  She had been referred to a therapist in the past, but does not feel like that works well for her. She was recently seen in the ED with rather significant episode of WPW.  She is compliant with her metoprolol .  She does have cardiology follow-up.  She had mildly low potassium of 3.2 during that visit.  She reports she was supplemented with 1 tab of potassium she does not know the dose.  She is trying to increase the potassium content in her diet since the hospitalization.  She also complains of burning with urination that started a few days ago.  She states she is prone to bacterial vaginosis, but wants to be certain this is not a UTI.      04/17/2022    8:28 AM 09/07/2021    9:51 AM 08/09/2021    1:49 PM 05/22/2019    2:17 PM 03/26/2017    3:04 PM  Depression screen PHQ 2/9   Decreased Interest 0 0 0 0 2  Down, Depressed, Hopeless 0 0 0 0 1  PHQ - 2 Score 0 0 0 0 3  Altered sleeping 0    2  Tired, decreased energy 0    2  Change in appetite 0    1  Feeling bad or failure about yourself  0    0  Trouble concentrating 0    2  Moving slowly or fidgety/restless 0    2  Suicidal thoughts 0    0  PHQ-9 Score 0    12    Allergies  Allergen Reactions   Other Other (See Comments)    Antihistamines or other stimulants-heart races   Social History   Social History Narrative   Marital status/children/pets: Married.  G0P0   Education/employment: High school graduate.   Safety:      -Wears a bicycle helmet riding a bike: Yes     -smoke alarm in the home:Yes     - wears seatbelt: Yes     - Feels safe in their relationships: Yes      Past Medical History:  Diagnosis Date   Anxiety with panic    Dysmenorrhea    inproved on Junel Fe1/20   Heart disease    Heart murmur of newborn  History of ADHD 11/06/2013   History of chicken pox    around age 28   SVT (supraventricular tachycardia) (HCC)    WPW (Wolff-Parkinson-White syndrome)    declined ablation.Hosp at [facility] at one month of age for Tachyarrrythmia and pulmonary hemorrhage   History reviewed. No pertinent surgical history. Family History  Problem Relation Age of Onset   Cervical cancer Mother    Alcohol abuse Mother    Depression Mother    Drug abuse Mother    Mental illness Mother    Diabetes Father    Alcohol abuse Father    High blood pressure Father    COPD Father    Mental retardation Father    Heart disease Father    COPD Paternal Grandmother    Hypertension Paternal Grandfather    Alcohol abuse Paternal Grandfather    Stroke Paternal Grandfather    Hearing loss Paternal Grandfather    Allergies as of 08/14/2023       Reactions   Other Other (See Comments)   Antihistamines or other stimulants-heart races        Medication List        Accurate as of August 14, 2023  1:57 PM.  If you have any questions, Pham your nurse or doctor.          STOP taking these medications    sertraline  50 MG tablet Commonly known as: ZOLOFT  Stopped by: Napolean Backbone       TAKE these medications    clonazePAM  0.5 MG tablet Commonly known as: KLONOPIN  1/2 tab in the day if needed for panic, 1 tab before bed.   metoprolol  succinate 25 MG 24 hr tablet Commonly known as: TOPROL -XL TAKE 1 AND 1/2 TABLETS DAILY BY MOUTH        All past medical history, surgical history, allergies, family history, immunizations andmedications were updated in the EMR today and reviewed under the history and medication portions of their EMR.     ROS Negative, with the exception of above mentioned in HPI   Objective:  BP 118/78   Pulse 98   Temp 98.1 F (36.7 C)   Wt 242 lb (109.8 kg)   SpO2 99%   BMI 34.23 kg/m  Body mass index is 34.23 kg/m. Physical Exam Vitals and nursing note reviewed.  Constitutional:      General: She is not in acute distress.    Appearance: Normal appearance. She is normal weight. She is not ill-appearing or toxic-appearing.  HENT:     Head: Normocephalic and atraumatic.  Eyes:     General: No scleral icterus.       Right eye: No discharge.        Left eye: No discharge.     Extraocular Movements: Extraocular movements intact.     Conjunctiva/sclera: Conjunctivae normal.     Pupils: Pupils are equal, round, and reactive to light.  Skin:    Findings: No rash.  Neurological:     Mental Status: She is alert and oriented to person, place, and time. Mental status is at baseline.     Motor: No weakness.     Coordination: Coordination normal.     Gait: Gait normal.  Psychiatric:        Attention and Perception: Attention normal.        Mood and Affect: Mood is not anxious or depressed. Affect is not tearful.        Speech: Speech normal.  Behavior: Behavior normal. Behavior is cooperative.        Thought Content: Thought content normal.         Cognition and Memory: Cognition and memory normal.        Judgment: Judgment normal.     No results found. No results found. No results found for this or any previous visit (from the past 24 hours).  Assessment/Plan: Abigail Pham is a 28 y.o. female present for OV for  Anxiety-like symptoms (Primary) Feeling better She has been referred to therapy in the past and does not feel she wants to pursue this again. Discontinued Zoloft  50 mg daily- SE-nausea (only took 3-4 days) We discussed the use of benzodiazepine.  Patient understands this is a controlled substance. Continue Klonopin  0.5 mg nightly to help with sleep, and 0.25 mg in the day as needed for panic only  Reviewed expectations re: course of current medical issues. Discussed self-management of symptoms. Outlined signs and symptoms indicating need for more acute intervention. Patient verbalized understanding and all questions were answered. Patient received an After-Visit Summary.    No orders of the defined types were placed in this encounter.  No orders of the defined types were placed in this encounter.  Referral Orders  No referral(s) requested today     Note is dictated utilizing voice recognition software. Although note has been proof read prior to signing, occasional typographical errors still can be missed. If any questions arise, please do not hesitate to call for verification.   electronically signed by:  Napolean Backbone, DO   Primary Care - [place]

## 2023-08-14 NOTE — Patient Instructions (Signed)

## 2023-08-21 ENCOUNTER — Telehealth: Payer: Self-pay

## 2023-08-21 NOTE — Telephone Encounter (Signed)
 Called patient, left a message on voicemail to contact EP procedure team.

## 2023-08-26 NOTE — Telephone Encounter (Signed)
 2nd attempt: Called patient, left a message on voicemail to contact EP procedure team.

## 2023-08-27 NOTE — Telephone Encounter (Signed)
 Pt returning call, requesting cb

## 2023-08-27 NOTE — Telephone Encounter (Signed)
 Spoke with patient to complete pre-procedure call.     New medical conditions?  NO Recent hospitalizations or surgeries? ED - April 2025 - HR 242 Started any new medications?  Clonazepam  (PRN) Patient made aware to contact office to inform of any new medications started. Any changes in activities of daily living? NO  Pre-procedure testing scheduled: Lab work on Thursday, June 19th.   Confirmed patient is scheduled for SVT Ablation on Wednesday, July 2 with Dr. Manya Sells. Instructed patient to arrive at the Main Entrance A at [facility]: [address] [place], [place] [address] and check in at Admitting at 6:30 AM.  Patient advised to STOP Metoprolol  3 days prior to procedure. Last day to take June 28th. Patient aware.   Advised of plan to go home the same day and will only stay overnight if medically necessary. You MUST have a responsible adult to drive you home and MUST be with you the first 24 hours after you arrive home or your procedure could be cancelled.  Patient verbalized understanding to information provided and is agreeable to proceed with procedure.

## 2023-08-30 LAB — BASIC METABOLIC PANEL WITH GFR
BUN/Creatinine Ratio: 13 (ref 9–23)
BUN: 10 mg/dL (ref 6–20)
CO2: 19 mmol/L — ABNORMAL LOW (ref 20–29)
Calcium: 9.5 mg/dL (ref 8.7–10.2)
Chloride: 103 mmol/L (ref 96–106)
Creatinine, Ser: 0.78 mg/dL (ref 0.57–1.00)
Glucose: 94 mg/dL (ref 70–99)
Potassium: 4.4 mmol/L (ref 3.5–5.2)
Sodium: 138 mmol/L (ref 134–144)
eGFR: 105 mL/min/{1.73_m2} (ref >59)

## 2023-08-30 LAB — CBC
Hematocrit: 45.1 % (ref 34.0–46.6)
Hemoglobin: 14.3 g/dL (ref 11.1–15.9)
MCH: 28.9 pg (ref 26.6–33.0)
MCHC: 31.7 g/dL (ref 31.5–35.7)
MCV: 91 fL (ref 79–97)
Platelets: 230 10*3/uL (ref 150–450)
RBC: 4.95 x10E6/uL (ref 3.77–5.28)
RDW: 12.6 % (ref 11.7–15.4)
WBC: 9 10*3/uL (ref 3.4–10.8)

## 2023-09-04 ENCOUNTER — Telehealth (HOSPITAL_COMMUNITY): Payer: Self-pay

## 2023-09-04 NOTE — Telephone Encounter (Signed)
 Patient returned call to discuss upcoming procedure.   Labs: completed.   Any recent signs of acute illness or been started on antibiotics? No Any new medications started? No Any medications to hold? Hold Metoprolol  3 days prior to procedure- last dose on June 28.   Medication instructions:  On the morning of your procedure DO NOT take any medication.,or the procedure may be rescheduled. Nothing to eat or drink after 5:30 AM prior to your procedure per note on 6/25.  Confirmed patient is scheduled for SVT Ablation on Wednesday, July 2 with Dr. Danelle Birmingham. Instructed patient to arrive at the Main Entrance A at [facility]: [address] [place], [place] [address] and check in at Admitting at 11:30 AM  Advised of plan to go home the same day and will only stay overnight if medically necessary. You MUST have a responsible adult to drive you home and MUST be with you the first 24 hours after you arrive home or your procedure could be cancelled.  Patient verbalized understanding to all instructions provided and agreed to proceed with procedure.

## 2023-09-04 NOTE — Telephone Encounter (Signed)
 Attempted to reach patient to discuss upcoming procedure, no answer. Left VM for patient to return call.

## 2023-09-05 ENCOUNTER — Telehealth: Payer: Self-pay | Admitting: Internal Medicine

## 2023-09-05 NOTE — Telephone Encounter (Signed)
 Spoke with pt, she is concerned about being off metoprolol  prior to the ablation. 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 If she still needs to hold the metoprolol , she is asking for a work note. Aware will forward for Dr Waddell to review.

## 2023-09-05 NOTE — Telephone Encounter (Signed)
 Pt c/o medication issue:  1. Name of Medication: Metoprolol   2. How are you currently taking this medication (dosage and times per day)?   3. Are you having a reaction (difficulty breathing--STAT)?   4. What is your medication issue? She wants to talk to a nurse about  stopping her Metoprolol   for her Ablation on 09-11-23

## 2023-09-06 NOTE — Telephone Encounter (Signed)
 Patient is aware she will need to hold her metoprolol  prior to her procedure scheduled for 09/11/23. She is requesting a note for work this Sunday to give her employer because she is worried about having an SVT episode at work being off of the metoprolol .   Previous message with same request has been forwarded to Dr. Waddell, no response yet. Per Dr. Adrian nurse we are awaiting his response at this time. Patient verbalized understanding.

## 2023-09-06 NOTE — Telephone Encounter (Signed)
 Pt was calling to f/u and is requesting to hear something by the end of the day due to her returning back to work Sunday morning. Her job is requiring that she brings in a note. Please advise

## 2023-09-09 ENCOUNTER — Telehealth: Payer: Self-pay | Admitting: Internal Medicine

## 2023-09-09 NOTE — Telephone Encounter (Signed)
 Patient calling in about a letter that she needs for work. Please advise

## 2023-09-10 ENCOUNTER — Telehealth: Payer: Self-pay

## 2023-09-10 NOTE — Telephone Encounter (Signed)
 I called pt back and she ask if she could call her Insurance Co - I told her that was fine if she wanted to call.  She was upset because her and her Husband had taken time off work and she doesn't have any more days to take off and it would be a long time before she could reschedule the procedure.   She called me back and stated that the Insurance Co told her they would need a Peer to Peer call with the MD. Our office has scheduled the Peer to Peer to be done today with Dr. Waddell.   The patient stated that she wants to have the procedure done even if her Insurance denies to pay for it. I explained to her that it's a very expensive procedure and that Dr. Waddell could do it in 2 weeks. She said she couldn't take any more time off work and she would figure out how to pay for it. I told her I would try to speak to someone to figure out what she needs to do to be able to still have the procedure done if her Insurance denies even with the Peer to Peer.

## 2023-09-10 NOTE — Telephone Encounter (Signed)
 LM for pt to call back to reschedule her SVT Ablation with Dr. Waddell. She is currently scheduled on 7/2 but her Insurance has not approved this yet. I will offer her 7/16 at 12:30 PM when she calls back.

## 2023-09-10 NOTE — Telephone Encounter (Signed)
 I called pt and informed her that per Dr. Waddell her procedure has been approved since he done the Peer to Peer. She was very Adult nurse.   She confirmed she has been holding her Metoprolol  and that she does have a responsible adult to drive her home after her procedure.   She is aware to arrive at 11 am on 7/2.[email or web address]

## 2023-09-11 ENCOUNTER — Encounter (HOSPITAL_COMMUNITY)
Admission: RE | Disposition: A | Discharge status: Home / Self Care | Payer: Self-pay | Source: Home / Self Care | Attending: Internal Medicine

## 2023-09-11 ENCOUNTER — Ambulatory Visit (HOSPITAL_COMMUNITY)
Admission: RE | Admit: 2023-09-11 | Discharge: 2023-09-12 | Disposition: A | Discharge status: Home / Self Care | Attending: Internal Medicine | Admitting: Internal Medicine

## 2023-09-11 ENCOUNTER — Other Ambulatory Visit: Payer: Self-pay

## 2023-09-11 DIAGNOSIS — F17290 Nicotine dependence, other tobacco product, uncomplicated: Secondary | ICD-10-CM | POA: Diagnosis not present

## 2023-09-11 DIAGNOSIS — I456 Pre-excitation syndrome: Secondary | ICD-10-CM | POA: Diagnosis present

## 2023-09-11 DIAGNOSIS — I4719 Other supraventricular tachycardia: Secondary | ICD-10-CM | POA: Diagnosis not present

## 2023-09-11 DIAGNOSIS — I471 Supraventricular tachycardia, unspecified: Secondary | ICD-10-CM

## 2023-09-11 DIAGNOSIS — Z6834 Body mass index (BMI) 34.0-34.9, adult: Secondary | ICD-10-CM | POA: Insufficient documentation

## 2023-09-11 HISTORY — PX: SVT ABLATION: EP1225

## 2023-09-11 LAB — POCT ACTIVATED CLOTTING TIME: Activated Clotting Time: 135 s

## 2023-09-11 LAB — PREGNANCY, URINE: Preg Test, Ur: NEGATIVE

## 2023-09-11 MED ORDER — SODIUM CHLORIDE 0.9% FLUSH
3.0000 mL | Freq: Two times a day (BID) | INTRAVENOUS | Status: DC
  Administered 2023-09-11: 3 mL via INTRAVENOUS

## 2023-09-11 MED ORDER — PROTAMINE SULFATE 10 MG/ML IV SOLN
INTRAVENOUS | Status: AC
  Administered 2023-09-11: 30 mg via INTRAVENOUS
  Filled 2023-09-11: qty 5

## 2023-09-11 MED ORDER — SODIUM CHLORIDE 0.9 % IV SOLN: 250.0000 mL | INTRAVENOUS | Status: DC | PRN

## 2023-09-11 MED ORDER — LIDOCAINE HCL (PF) 1 % IJ SOLN
INTRAMUSCULAR | Status: AC
  Filled 2023-09-11: qty 30

## 2023-09-11 MED ORDER — HEPARIN SODIUM (PORCINE) 1000 UNIT/ML IJ SOLN
INTRAMUSCULAR | Status: AC
  Filled 2023-09-11: qty 1

## 2023-09-11 MED ORDER — SODIUM CHLORIDE 0.9% FLUSH
3.0000 mL | INTRAVENOUS | Status: DC | PRN
Start: 2023-09-11 — End: 2023-09-12

## 2023-09-11 MED ORDER — FENTANYL CITRATE (PF) 100 MCG/2ML IJ SOLN: 50.0000 ug | Freq: Once | INTRAMUSCULAR | Status: DC

## 2023-09-11 MED ORDER — HEPARIN SODIUM (PORCINE) 1000 UNIT/ML IJ SOLN
INTRAMUSCULAR | Status: DC | PRN
  Administered 2023-09-11 (×2): 3000 [IU] via INTRAVENOUS
  Administered 2023-09-11: 6000 [IU] via INTRAVENOUS

## 2023-09-11 MED ORDER — MIDAZOLAM HCL 5 MG/5ML IJ SOLN
INTRAMUSCULAR | Status: AC
Start: 2023-09-11 — End: 2023-09-11
  Filled 2023-09-11: qty 5

## 2023-09-11 MED ORDER — MIDAZOLAM HCL 5 MG/5ML IJ SOLN
INTRAMUSCULAR | Status: DC | PRN
  Administered 2023-09-11 (×5): 2 mg via INTRAVENOUS
  Administered 2023-09-11: 1 mg via INTRAVENOUS
  Administered 2023-09-11 (×2): 2 mg via INTRAVENOUS
  Administered 2023-09-11: 1 mg via INTRAVENOUS

## 2023-09-11 MED ORDER — SODIUM CHLORIDE 0.9 % IV SOLN: INTRAVENOUS | Status: DC

## 2023-09-11 MED ORDER — CLONAZEPAM 0.5 MG PO TABS
0.5000 mg | ORAL_TABLET | Freq: Once | ORAL | Status: AC | PRN
  Administered 2023-09-11: 0.5 mg via ORAL
  Filled 2023-09-11: qty 1

## 2023-09-11 MED ORDER — PROTAMINE SULFATE 10 MG/ML IV SOLN
30.0000 mg | Freq: Once | INTRAVENOUS | Status: AC
  Filled 2023-09-11: qty 3

## 2023-09-11 MED ORDER — HEPARIN SODIUM (PORCINE) 1000 UNIT/ML IJ SOLN
INTRAMUSCULAR | Status: AC
  Filled 2023-09-11: qty 10

## 2023-09-11 MED ORDER — ONDANSETRON HCL 4 MG/2ML IJ SOLN: 4.0000 mg | Freq: Four times a day (QID) | INTRAMUSCULAR | Status: DC | PRN

## 2023-09-11 MED ORDER — MIDAZOLAM HCL 5 MG/5ML IJ SOLN
INTRAMUSCULAR | Status: AC
  Filled 2023-09-11: qty 5

## 2023-09-11 MED ORDER — KETOROLAC TROMETHAMINE 30 MG/ML IJ SOLN
30.0000 mg | Freq: Once | INTRAMUSCULAR | Status: AC
  Administered 2023-09-11: 30 mg via INTRAVENOUS

## 2023-09-11 MED ORDER — KETOROLAC TROMETHAMINE 30 MG/ML IJ SOLN
INTRAMUSCULAR | Status: AC
  Filled 2023-09-11: qty 1

## 2023-09-11 MED ORDER — FENTANYL CITRATE (PF) 100 MCG/2ML IJ SOLN
INTRAMUSCULAR | Status: AC
  Filled 2023-09-11: qty 2

## 2023-09-11 MED ORDER — FENTANYL CITRATE (PF) 100 MCG/2ML IJ SOLN
INTRAMUSCULAR | Status: DC | PRN
  Administered 2023-09-11 (×4): 25 ug via INTRAVENOUS
  Administered 2023-09-11: 50 ug
  Administered 2023-09-11 (×4): 25 ug via INTRAVENOUS

## 2023-09-11 MED ORDER — LIDOCAINE HCL (PF) 1 % IJ SOLN
INTRAMUSCULAR | Status: DC | PRN
  Administered 2023-09-11: 30 mL

## 2023-09-11 MED ORDER — ACETAMINOPHEN 325 MG PO TABS: 650.0000 mg | ORAL_TABLET | ORAL | Status: DC | PRN

## 2023-09-11 MED ORDER — FENTANYL CITRATE (PF) 100 MCG/2ML IJ SOLN
INTRAMUSCULAR | Status: AC
  Administered 2023-09-11: 100 ug
  Filled 2023-09-11: qty 2

## 2023-09-11 MED ORDER — HEPARIN (PORCINE) IN NACL 1000-0.9 UT/500ML-% IV SOLN
INTRAVENOUS | Status: DC | PRN
  Administered 2023-09-11: 500 mL

## 2023-09-11 NOTE — H&P (Signed)
 HPI Abigail Pham returns today for followup. She is a pleasant 28 yo woman with morbid obesity and a h/o SVT. She had very subtle pre-excitation. She has been on a beta blocker and her symptoms are much improved. She has put on hold getting pregnant. Since I saw her last over 3 years ago she notes that she has had only occasional symptoms of SVT Allergies       Allergies  Allergen Reactions   Other Other (See Comments)      Antihistamines or other stimulants-heart races                Current Outpatient Medications  Medication Sig Dispense Refill   fluocinonide  cream (LIDEX ) 0.05 % Apply 1 Application topically 2 (two) times daily. Hold until pt request please.thanks 60 g 0   metoprolol  succinate (TOPROL -XL) 25 MG 24 hr tablet TAKE 1 AND 1/2 TABLETS DAILY BY MOUTH 135 tablet 2      No current facility-administered medications for this visit.              Past Medical History:  Diagnosis Date   Anxiety with panic     Dysmenorrhea      inproved on Junel Fe1/20   Heart disease     Heart murmur of newborn     History of ADHD 11/06/2013   History of chicken pox      around age 60   SVT (supraventricular tachycardia) (HCC)     WPW (Wolff-Parkinson-White syndrome)      declined ablation.Hosp at [facility] at one month of age for Tachyarrrythmia and pulmonary hemorrhage          ROS:    All systems reviewed and negative except as noted in the HPI.     History reviewed. No pertinent surgical history.              Family History  Problem Relation Age of Onset   Cervical cancer Mother     Alcohol abuse Mother     Depression Mother     Drug abuse Mother     Mental illness Mother     Diabetes Father     Alcohol abuse Father     High blood pressure Father     COPD Father     Mental retardation Father     Heart disease Father     COPD Paternal Grandmother     Hypertension Paternal Grandfather     Alcohol abuse Paternal Grandfather     Stroke Paternal  Grandfather     Hearing loss Paternal Grandfather              Social History         Socioeconomic History   Marital status: Married      Spouse name: Not on file   Number of children: Not on file   Years of education: Not on file   Highest education level: Not on file  Occupational History   Not on file  Tobacco Use   Smoking status: Never      Passive exposure: Yes   Smokeless tobacco: Never   Tobacco comments:      pt smokes vapors occ with nicotine 3 mg   Vaping Use   Vaping status: Every Day   Substances: Nicotine, Flavoring  Substance and Sexual Activity   Alcohol use: Yes      Alcohol/week: 0.0 standard drinks of alcohol  Comment: occ   Drug use: No   Sexual activity: Yes      Partners: Male      Birth control/protection: Condom  Other Topics Concern   Not on file  Social History Narrative    Marital status/children/pets: Married.  G0P0    Education/employment: High school graduate.    Safety:       -Wears a bicycle helmet riding a bike: Yes      -smoke alarm in the home:Yes      - wears seatbelt: Yes      - Feels safe in their relationships: Yes         Social Drivers of Acupuncturist Strain: Not on file  Food Insecurity: Not on file  Transportation Needs: Not on file  Physical Activity: Not on file  Stress: Not on file  Social Connections: Unknown (07/24/2021)    Received from [facility], [facility]    Social Network     Social Network: Not on file  Intimate Partner Violence: Unknown (06/15/2021)    Received from [employer], [facility]    HITS     Physically Hurt: Not on file     Insult or Talk Down To: Not on file     Threaten Physical Harm: Not on file     Scream or Curse: Not on file        BP 116/80   Pulse 78   Ht 5' 10.5 (1.791 m)   Wt 243 lb (110.2 kg)   SpO2 97%   BMI 34.37 kg/m    Physical Exam:   Well appearing NAD HEENT: Unremarkable Neck:  No JVD, no thyromegally Lymphatics:  No  adenopathy Back:  No CVA tenderness Lungs:  Clear HEART:  Regular rate rhythm, no murmurs, no rubs, no clicks Abd:  soft, positive bowel sounds, no organomegally, no rebound, no guarding Ext:  2 plus pulses, no edema, no cyanosis, no clubbing Skin:  No rashes no nodules Neuro:  CN II through XII intact, motor grossly intact   EKG - nsr with no pre-excitation   DEVICE  Normal device function.  See PaceArt for details.    Assess/Plan:    SVT - her symptoms are well controlled. She will continue her beta blocker. We discussed the indications for catheter ablation and she is strongly considering as she would like to become pregnant.   2. Obesity - I strongly encouraged the patient to lose weight.    Danelle TaylorMD

## 2023-09-11 NOTE — Progress Notes (Signed)
 Site area: R groin (1x arterial & 3x venous) Site Prior to Removal:  Level 0 Pressure Applied For: total (d/t oozing at arterial site) Manual:   yes Patient Status During Pull:  stable Post Pull Site:  Level 0 Post Pull Instructions Given:  yes Post Pull Pulses Present: PT doppler bilateral Dressing Applied:  gauze & tegaderm Bedrest begins @ 1815 Comments: arterial sheath pulled @ 1705, initial pressure held for 40 min, addt'l 30 for ooze; venous sheaths pulled @ 1735, pressure held for 15 min)

## 2023-09-12 ENCOUNTER — Encounter (HOSPITAL_COMMUNITY): Payer: Self-pay | Admitting: Internal Medicine

## 2023-09-12 ENCOUNTER — Encounter: Payer: Self-pay | Admitting: Physician Assistant

## 2023-09-12 ENCOUNTER — Telehealth: Payer: Self-pay

## 2023-09-12 DIAGNOSIS — Z6834 Body mass index (BMI) 34.0-34.9, adult: Secondary | ICD-10-CM | POA: Diagnosis not present

## 2023-09-12 DIAGNOSIS — I456 Pre-excitation syndrome: Secondary | ICD-10-CM

## 2023-09-12 DIAGNOSIS — F17290 Nicotine dependence, other tobacco product, uncomplicated: Secondary | ICD-10-CM | POA: Diagnosis not present

## 2023-09-12 DIAGNOSIS — I4719 Other supraventricular tachycardia: Secondary | ICD-10-CM | POA: Diagnosis not present

## 2023-09-12 LAB — POCT ACTIVATED CLOTTING TIME: Activated Clotting Time: 193 s

## 2023-09-12 NOTE — Telephone Encounter (Signed)
 Spoke with Pt to check in. Pt is doing well and will call next week if she needs anything.

## 2023-09-12 NOTE — Plan of Care (Signed)
  Problem: Education: Goal: Knowledge of General Education information will improve Description: Including pain rating scale, medication(s)/side effects and non-pharmacologic comfort measures Outcome: Adequate for Discharge   Problem: Health Behavior/Discharge Planning: Goal: Ability to manage health-related needs will improve Outcome: Adequate for Discharge   Problem: Clinical Measurements: Goal: Ability to maintain clinical measurements within normal limits will improve Outcome: Adequate for Discharge Goal: Will remain free from infection Outcome: Adequate for Discharge Goal: Diagnostic test results will improve Outcome: Adequate for Discharge Goal: Respiratory complications will improve Outcome: Adequate for Discharge Goal: Cardiovascular complication will be avoided Outcome: Adequate for Discharge   Problem: Activity: Goal: Risk for activity intolerance will decrease Outcome: Adequate for Discharge   Problem: Nutrition: Goal: Adequate nutrition will be maintained Outcome: Adequate for Discharge   Problem: Coping: Goal: Level of anxiety will decrease Outcome: Adequate for Discharge   Problem: Elimination: Goal: Will not experience complications related to bowel motility Outcome: Adequate for Discharge Goal: Will not experience complications related to urinary retention Outcome: Adequate for Discharge   Problem: Pain Managment: Goal: General experience of comfort will improve and/or be controlled Outcome: Adequate for Discharge   Problem: Safety: Goal: Ability to remain free from injury will improve Outcome: Adequate for Discharge   Problem: Skin Integrity: Goal: Risk for impaired skin integrity will decrease Outcome: Adequate for Discharge   Problem: Education: Goal: Understanding of disease, treatment, and recovery process will improve Outcome: Adequate for Discharge   Problem: Activity: Goal: Ability to return to baseline activity level will  improve Outcome: Adequate for Discharge   Problem: Cardiac: Goal: Ability to maintain adequate cardiovascular perfusion will improve Outcome: Adequate for Discharge Goal: Vascular access site(s) Level 0-1 will be maintained Outcome: Adequate for Discharge   Problem: Health Behavior/ Discharge Planning: Goal: Ability to safely manage health related needs after discharge Outcome: Adequate for Discharge

## 2023-09-12 NOTE — Discharge Summary (Addendum)
 ELECTROPHYSIOLOGY PROCEDURE DISCHARGE SUMMARY    Patient ID: Abigail Pham,  MRN: [id number], DOB/AGE: 1996/01/17 28 y.o.  Admit date: 09/11/2023 Discharge date: 09/12/2023  Primary Care Physician: Catherine Charlies LABOR, DO  Electrophysiologist: Dr. Waddell  Primary Discharge Diagnosis:  SVT WPW  Secondary Discharge Diagnosis:  Anxiety ADHD  Procedures This Admission:  1.  Electrophysiology study and radiofrequency catheter ablation on 7/2/25by Dr Waddell   This study demonstrated    Conclusion: Successful albeit difficult EP study and catheter ablation of manifest WPW syndrome utilizing a left lateral accessory pathway and easily inducible AV reentrant tachycardia. The accessory pathway was successfully ablated at 330 on the mitral valve annulus utilizing a transseptal approach after initial retrograde approach was ineffective. It should be noted that the patient was quite difficult to sedate and was particularly uncooperative during the procedure.  No early apparent complications  Brief HPI: Abigail Pham is a 28 y.o. female with a history of SVT/WPW.  Risks, benefits, and alternatives to catheter ablation of atrial fibrillation were reviewed with the patient who wished to proceed.    Hospital Course:  The patient was admitted and underwent EPS/RFCA of atrial fibrillation with details as outlined above.  They were monitored on telemetry overnight which demonstrated SR 80s, with periods of ST 120's-150's when OOB, sinus, not her SVT, Dr. Waddell discussed this with her, continue her home Toprol , BP stable.  Groin site is without complication on the day of discharge.  The patient feels well today, denies CP, SOB, site discomfort.  She was examined by Dr. Waddell and considered to be stable for discharge.  Wound care and restrictions were reviewed with the patient.  The patient will be seen back the EP team in ~ 1 mo for post ablation follow up.   Dr. Waddell discussed the patient's job/job  duties, he advised she could return to work on Monday 09/16/23 (she is scheduled to return on Tuesday 09/17/23)  Continue home Toprol  unchanged   Physical Exam: Vitals:   09/11/23 2300 09/11/23 2327 09/12/23 0457 09/12/23 0828  BP: 108/66 108/68 111/75 108/67  Pulse: 80 81 92 86  Resp:  18 16 16   Temp:  98.6 F (37 C) 98.6 F (37 C) 97.6 F (36.4 C)  TempSrc:  Oral Oral Oral  SpO2: 98% 94% 95% 98%  Weight:      Height:        GEN- The patient is well appearing, alert and oriented x 3 today.   HEENT: normocephalic, atraumatic; sclera clear, conjunctiva pink; hearing intact; oropharynx clear; neck supple  Lungs-  CTA b/l, normal work of breathing.  No wheezes, rales, rhonchi Heart- RRR, no murmurs, rubs or gallops  GI- soft, non-tender, non-distended, bowel sounds present  Extremities- no clubbing, cyanosis, or edema;  R groin without hematoma/bruit MS- no significant deformity or atrophy Skin- warm and dry, no rash or lesion Psych- euthymic mood, full affect Neuro- strength and sensation are intact   Labs:   Lab Results  Component Value Date   WBC 9.0 08/29/2023   HGB 14.3 08/29/2023   HCT 45.1 08/29/2023   MCV 91 08/29/2023   PLT 230 08/29/2023   No results for input(s): NA, K, CL, CO2, BUN, CREATININE, CALCIUM, PROT, BILITOT, ALKPHOS, ALT, AST, GLUCOSE in the last 168 hours.  Invalid input(s): LABALBU   Discharge Medications:  Allergies as of 09/12/2023       Reactions   Other Other (See Comments)   Antihistamines or other  stimulants-heart races Caffeine         Medication List     TAKE these medications    clonazePAM  0.5 MG tablet Commonly known as: KLONOPIN  1/2 tab in the day if needed for panic, 1 tab before bed. What changed:  how much to take how to take this when to take this reasons to take this additional instructions   metoprolol  succinate 25 MG 24 hr tablet Commonly known as: TOPROL -XL TAKE 1 AND 1/2 TABLETS  DAILY BY MOUTH        Disposition: Home Discharge Instructions     Diet - low sodium heart healthy   Complete by: As directed    Increase activity slowly   Complete by: As directed         Duration of Discharge Encounter: 13 minutes, APP time.  Bonney Charlies Arthur, PA-C 09/12/2023 8:46 AM  EP Attending  Patient seen and examined. Agree with the findings as noted above. The patient is stable after EPS/RFA of manifest WPW syndrome with easily inducible SVT. Tele demonstrates ST with no evidence of ventricular pre-excitation. Her groin demonstrates no hematoma or ecchymosis or bleeding. CV with a RRR. Lungs are clear. Tele with NSR and ST.  A/P WPW syndrome s/p ablation - she is stable for DC home. Usual followup as outlined above.  Danelle Larita Deremer,MD

## 2023-09-12 NOTE — Discharge Instructions (Signed)

## 2023-09-23 LAB — POCT ACTIVATED CLOTTING TIME: Activated Clotting Time: 268 s

## 2023-10-10 NOTE — Progress Notes (Unsigned)
  Electrophysiology Office Note:   Date:  10/11/2023  ID:  Abigail Pham, DOB November 16, 1995, MRN [id number]  Primary Cardiologist: None Electrophysiologist: Danelle Birmingham, MD   Electrophysiologist:  Danelle Birmingham, MD      History of Present Illness:   Abigail Pham is a 28 y.o. female with h/o SVT and obesity seen today for routine electrophysiology follow-up s/p Ablation.  S/p EPS and ablation 09/11/2023   Since last being seen in our clinic the patient reports doing OK. Still having tachy-palpitations about weekly. HR in 140-150s. Longest episode has been ~ 10 minutes. Usually < 5.  Not every day. Aggravated at times with activity. Otherwise, she denies chest pain, dyspnea, PND, orthopnea, nausea, vomiting, dizziness, syncope, edema, weight gain, or early satiety.    Review of systems complete and found to be negative unless listed in HPI.   EP Information / Studies Reviewed:    EKG is ordered today. Personal review as below.  EKG Interpretation Date/Time:  Friday October 11 2023 09:59:44 EDT Ventricular Rate:  59 PR Interval:  148 QRS Duration:  90 QT Interval:  436 QTC Calculation: 431 R Axis:   59  Text Interpretation: Sinus bradycardia When compared with ECG of 12-Sep-2023 04:57, Nonspecific T wave abnormality no longer evident in Lateral leads Confirmed by Lesia Sharper [phone]) on 10/11/2023 10:04:49 AM    Arrhythmia/Device History No specialty comments available.   Physical Exam:   VS:  BP 106/76   Pulse (!) 58   Ht 5' 11 (1.803 m)   Wt 244 lb (110.7 kg)   SpO2 97%   BMI 34.03 kg/m    Wt Readings from Last 3 Encounters:  10/11/23 244 lb (110.7 kg)  09/11/23 240 lb (108.9 kg)  08/14/23 242 lb (109.8 kg)     GEN: No acute distress NECK: No JVD; No carotid bruits CARDIAC: Regular rate and rhythm, no murmurs, rubs, gallops RESPIRATORY:  Clear to auscultation without rales, wheezing or rhonchi  ABDOMEN: Soft, non-tender, non-distended EXTREMITIES:  No edema; No  deformity   ASSESSMENT AND PLAN:    SVT S/p ablation of manifest WPW syndrome with a left lateral accesory pathway and easily inducible AV reentrant tachycardia 09/11/2023 Occasional recurrence, but not very bothersome.  Continue toprol  50 mg daily, which is what she has been taking.   Consider monitor for clarification of arrhythmia if continues. (She would prefer to wait until cooler months to wear)  Obesity Body mass index is 34.03 kg/m.  Encouraged lifestyle modification  Family planning Can discuss with OB safest BB option and regimen if decides to pursue pregnancy.    Follow up with EP Team in 6 months  Signed, Sharper Prentice Lesia, PA-C

## 2023-10-11 ENCOUNTER — Ambulatory Visit: Attending: Student | Admitting: Student

## 2023-10-11 ENCOUNTER — Encounter: Payer: Self-pay | Admitting: Student

## 2023-10-11 VITALS — BP 106/76 | HR 58 | Ht 71.0 in | Wt 244.0 lb

## 2023-10-11 DIAGNOSIS — I456 Pre-excitation syndrome: Secondary | ICD-10-CM

## 2023-10-11 DIAGNOSIS — I471 Supraventricular tachycardia, unspecified: Secondary | ICD-10-CM | POA: Diagnosis not present

## 2023-10-11 MED ORDER — METOPROLOL SUCCINATE ER 25 MG PO TB24: 50.0000 mg | ORAL_TABLET | Freq: Every day | ORAL | 2 refills | Status: DC

## 2023-10-11 NOTE — Patient Instructions (Signed)
 Medication Instructions:  Increase metoprolol  succinate (Toprol  XL) to 50 mg daily *If you need a refill on your cardiac medications before your next appointment, please call your pharmacy*  Lab Work: None ordered If you have labs (blood work) drawn today and your tests are completely normal, you will receive your results only by: MyChart Message (if you have MyChart) OR A paper copy in the mail If you have any lab test that is abnormal or we need to change your treatment, we will call you to review the results.  Follow-Up: At [facility], you and your health needs are our priority.  As part of our continuing mission to provide you with exceptional heart care, our providers are all part of one team.  This team includes your primary Cardiologist (physician) and Advanced Practice Providers or APPs (Physician Assistants and Nurse Practitioners) who all work together to provide you with the care you need, when you need it.  Your next appointment:   6 month(s)  Provider:   You will see one of the following Advanced Practice Providers on your designated Care Team:   Charlies Arthur, PA-C Michael Andy Tillery, PA-C Suzann Riddle, NP Daphne Barrack, NP

## 2024-01-22 ENCOUNTER — Ambulatory Visit: Attending: Internal Medicine | Admitting: Internal Medicine

## 2024-01-22 ENCOUNTER — Encounter: Payer: Self-pay | Admitting: Internal Medicine

## 2024-01-22 VITALS — BP 118/70 | HR 68 | Ht 71.0 in | Wt 241.0 lb

## 2024-01-22 DIAGNOSIS — R002 Palpitations: Secondary | ICD-10-CM

## 2024-01-22 NOTE — Progress Notes (Signed)
 HPI Ms. Pensinger returns today for followup. She is a pleasant 28 yo woman with SVT due to WPW syndrome and is s/p catheter ablation of a far left sided AP. She had easily inducible SVT at over 200/min. She underwent catheter ablation via a transeptal approach after the retrograde aortic approach was unsuccessful. The patient has been bothered by sinus tachy with HR's in the 160 range. She wears a watch that monitors her HR. She hopes to try to become pregnant.  Allergies  Allergen Reactions   Other Other (See Comments)    Antihistamines or other stimulants-heart races Caffeine      Current Outpatient Medications  Medication Sig Dispense Refill   clonazePAM  (KLONOPIN ) 0.5 MG tablet 1/2 tab in the day if needed for panic, 1 tab before bed. 45 tablet 5   metoprolol  succinate (TOPROL -XL) 25 MG 24 hr tablet Take 2 tablets (50 mg total) by mouth daily. (Patient taking differently: Take 37.5 mg by mouth daily. Patient takes one and one-half tablet daily) 180 tablet 2   Prenatal Vit-Fe Fumarate-FA (PRENATAL MULTIVITAMIN) TABS tablet Take 1 tablet by mouth daily at 12 noon.     No current facility-administered medications for this visit.     Past Medical History:  Diagnosis Date   Anxiety with panic    Dysmenorrhea    inproved on Junel Fe1/20   Heart disease    Heart murmur of newborn    History of ADHD 11/06/2013   History of chicken pox    around age 42   SVT (supraventricular tachycardia)    WPW (Wolff-Parkinson-White syndrome)    declined ablation.Hosp at [facility] at one month of age for Tachyarrrythmia and pulmonary hemorrhage    ROS:   All systems reviewed and negative except as noted in the HPI.   Past Surgical History:  Procedure Laterality Date   SVT ABLATION N/A 09/11/2023   Procedure: SVT ABLATION;  Surgeon: Waddell Danelle ORN, MD;  Location: [facility] INVASIVE CV LAB;  Service: Cardiovascular;  Laterality: N/A;     Family History  Problem Relation Age of Onset   Cervical  cancer Mother    Alcohol abuse Mother    Depression Mother    Drug abuse Mother    Mental illness Mother    Diabetes Father    Alcohol abuse Father    High blood pressure Father    COPD Father    Mental retardation Father    Heart disease Father    COPD Paternal Grandmother    Hypertension Paternal Grandfather    Alcohol abuse Paternal Grandfather    Stroke Paternal Grandfather    Hearing loss Paternal Grandfather      Social History   Socioeconomic History   Marital status: Married    Spouse name: Not on file   Number of children: Not on file   Years of education: Not on file   Highest education level: Not on file  Occupational History   Not on file  Tobacco Use   Smoking status: Never    Passive exposure: Yes   Smokeless tobacco: Never   Tobacco comments:    pt smokes vapors occ with nicotine 3 mg   Vaping Use   Vaping status: Every Day   Substances: Nicotine, Flavoring  Substance and Sexual Activity   Alcohol use: Yes    Alcohol/week: 0.0 standard drinks of alcohol    Comment: occ   Drug use: No   Sexual activity: Yes    Partners:  Male    Birth control/protection: Condom  Other Topics Concern   Not on file  Social History Narrative   Marital status/children/pets: Married.  G0P0   Education/employment: High school graduate.   Safety:      -Wears a bicycle helmet riding a bike: Yes     -smoke alarm in the home:Yes     - wears seatbelt: Yes     - Feels safe in their relationships: Yes      Social Drivers of Corporate Investment Banker Strain: Not on file  Food Insecurity: No Food Insecurity (09/11/2023)   Hunger Vital Sign    Worried About Running Out of Food in the Last Year: Never true    Ran Out of Food in the Last Year: Never true  Transportation Needs: No Transportation Needs (09/11/2023)   PRAPARE - Administrator, Civil Service (Medical): No    Lack of Transportation (Non-Medical): No  Physical Activity: Not on file  Stress: Not on  file  Social Connections: Unknown (07/24/2021)   Received from [facility]   Social Network    Social Network: Not on file  Intimate Partner Violence: Not At Risk (09/11/2023)   Humiliation, Afraid, Rape, and Kick questionnaire    Fear of Current or Ex-Partner: No    Emotionally Abused: No    Physically Abused: No    Sexually Abused: No     BP 118/70 (BP Location: Left Arm, Patient Position: Sitting, Cuff Size: Large)   Pulse 68   Ht 5' 11 (1.803 m)   Wt 241 lb (109.3 kg)   SpO2 98%   BMI 33.61 kg/m   Physical Exam:  Well appearing NAD HEENT: Unremarkable Neck:  No JVD, no thyromegally Lymphatics:  No adenopathy Back:  No CVA tenderness Lungs:  Clear HEART:  Regular rate rhythm, no murmurs, no rubs, no clicks Abd:  soft, positive bowel sounds, no organomegally, no rebound, no guarding Ext:  2 plus pulses, no edema, no cyanosis, no clubbing Skin:  No rashes no nodules Neuro:  CN II through XII intact, motor grossly intact  EKG - nsr with no pre-excitation.  Assess/Plan: WPW - she is s/p ablation with no evidence of a residual AP or SVT. Palpitations - she has sinus tachycardia. I encouraged her to exercise and avoid caffeine.  Sinus tachy - she would like to become pregnant and I have recommended she wean off of her toprol  and stop looking at her HR monitor.  Danelle Dulce Martian,MD

## 2024-01-22 NOTE — Patient Instructions (Addendum)
 Medication Instructions:  Your physician has recommended you make the following change in your medication:  Decrease toprol  XL to 1 tablet daily for 2 weeks. Then decrease 1/2 tablet daily for 2 weeks.  Stop after.  Lab Work: None ordered.  You may go to any [facility] Location for your lab work:  [facility] - [address] ([facility]) - [address] [phone] [address]   - [address]   [place]  - [address]   [place] - [address] - [address] [place]  - [address] [place] [address] ([facility]   If you have labs (blood work) drawn today and your tests are completely normal, you will receive your results only by: [employer] (if you have MyChart)  If you have any lab test that is abnormal or we need to change your treatment, we will call you or send a MyChart message to review the results.  Testing/Procedures: None ordered.  Follow-Up: At [facility], you and your health needs are our priority.  As part of our continuing mission to provide you with exceptional heart care, we have created designated Provider Care Teams.  These Care Teams include your primary Cardiologist (physician) and Advanced Practice Providers (APPs -  Physician Assistants and Nurse Practitioners) who all work together to provide you with the care you need, when you need it.  Your next appointment:   As needed

## 2024-03-20 ENCOUNTER — Other Ambulatory Visit: Payer: Self-pay | Admitting: Student

## 2024-03-20 DIAGNOSIS — I456 Pre-excitation syndrome: Secondary | ICD-10-CM

## 2024-03-20 MED ORDER — METOPROLOL SUCCINATE ER 25 MG PO TB24: 50.0000 mg | ORAL_TABLET | Freq: Every day | ORAL | 3 refills | Status: AC
